# Patient Record
Sex: Male | Born: 1970 | Hispanic: Yes | Marital: Single | Smoking: Never smoker
Health system: Southern US, Community
[De-identification: ages and names within clinical notes are randomized; demographics above are authoritative.]

## PROBLEM LIST (undated history)

## (undated) DIAGNOSIS — Z87442 Personal history of urinary calculi: Secondary | ICD-10-CM

## (undated) HISTORY — PX: NO PAST SURGERIES: SHX2092

---

## 2013-08-17 ENCOUNTER — Emergency Department (HOSPITAL_COMMUNITY)
Admission: EM | Admit: 2013-08-17 | Discharge: 2013-08-17 | Disposition: A | Discharge status: Home / Self Care | Payer: Self-pay | Attending: Emergency Medicine | Admitting: Emergency Medicine

## 2013-08-17 ENCOUNTER — Encounter (HOSPITAL_COMMUNITY): Payer: Self-pay | Admitting: Emergency Medicine

## 2013-08-17 ENCOUNTER — Emergency Department (HOSPITAL_COMMUNITY): Payer: Self-pay

## 2013-08-17 DIAGNOSIS — R112 Nausea with vomiting, unspecified: Secondary | ICD-10-CM | POA: Insufficient documentation

## 2013-08-17 DIAGNOSIS — R1032 Left lower quadrant pain: Secondary | ICD-10-CM | POA: Insufficient documentation

## 2013-08-17 DIAGNOSIS — R1033 Periumbilical pain: Secondary | ICD-10-CM | POA: Insufficient documentation

## 2013-08-17 DIAGNOSIS — R1110 Vomiting, unspecified: Secondary | ICD-10-CM

## 2013-08-17 DIAGNOSIS — R197 Diarrhea, unspecified: Secondary | ICD-10-CM | POA: Insufficient documentation

## 2013-08-17 DIAGNOSIS — R1013 Epigastric pain: Secondary | ICD-10-CM | POA: Insufficient documentation

## 2013-08-17 LAB — CBC WITH DIFFERENTIAL/PLATELET
Basophils Absolute: 0 10*3/uL (ref 0.0–0.1)
Basophils Relative: 0 % (ref 0–1)
EOS PCT: 1 % (ref 0–5)
Eosinophils Absolute: 0.1 10*3/uL (ref 0.0–0.7)
HEMATOCRIT: 43 % (ref 39.0–52.0)
Hemoglobin: 14.9 g/dL (ref 13.0–17.0)
LYMPHS ABS: 1.6 10*3/uL (ref 0.7–4.0)
LYMPHS PCT: 15 % (ref 12–46)
MCH: 30.2 pg (ref 26.0–34.0)
MCHC: 34.7 g/dL (ref 30.0–36.0)
MCV: 87 fL (ref 78.0–100.0)
MONO ABS: 1 10*3/uL (ref 0.1–1.0)
Monocytes Relative: 10 % (ref 3–12)
Neutro Abs: 7.9 10*3/uL — ABNORMAL HIGH (ref 1.7–7.7)
Neutrophils Relative %: 74 % (ref 43–77)
Platelets: 301 10*3/uL (ref 150–400)
RBC: 4.94 MIL/uL (ref 4.22–5.81)
RDW: 13 % (ref 11.5–15.5)
WBC: 10.7 10*3/uL — AB (ref 4.0–10.5)

## 2013-08-17 LAB — LIPASE, BLOOD: Lipase: 42 U/L (ref 11–59)

## 2013-08-17 LAB — COMPREHENSIVE METABOLIC PANEL
ALT: 14 U/L (ref 0–53)
AST: 21 U/L (ref 0–37)
Albumin: 4 g/dL (ref 3.5–5.2)
Alkaline Phosphatase: 116 U/L (ref 39–117)
BUN: 14 mg/dL (ref 6–23)
CO2: 23 mEq/L (ref 19–32)
CREATININE: 0.85 mg/dL (ref 0.50–1.35)
Calcium: 9.6 mg/dL (ref 8.4–10.5)
Chloride: 103 mEq/L (ref 96–112)
Glucose, Bld: 102 mg/dL — ABNORMAL HIGH (ref 70–99)
Potassium: 3.7 mEq/L (ref 3.7–5.3)
Sodium: 142 mEq/L (ref 137–147)
Total Bilirubin: 0.5 mg/dL (ref 0.3–1.2)
Total Protein: 7.9 g/dL (ref 6.0–8.3)

## 2013-08-17 MED ORDER — IOHEXOL 300 MG/ML  SOLN
20.0000 mL | INTRAMUSCULAR | Status: DC
  Administered 2013-08-17: 20 mL via ORAL

## 2013-08-17 MED ORDER — ONDANSETRON HCL 4 MG/2ML IJ SOLN
4.0000 mg | Freq: Once | INTRAMUSCULAR | Status: AC
  Administered 2013-08-17: 4 mg via INTRAVENOUS
  Filled 2013-08-17: qty 2

## 2013-08-17 MED ORDER — IOHEXOL 300 MG/ML  SOLN
100.0000 mL | Freq: Once | INTRAMUSCULAR | Status: AC | PRN
  Administered 2013-08-17: 100 mL via INTRAVENOUS

## 2013-08-17 MED ORDER — ONDANSETRON 4 MG PO TBDP: 4.0000 mg | ORAL_TABLET | Freq: Three times a day (TID) | ORAL | Status: DC | PRN

## 2013-08-17 MED ORDER — MORPHINE SULFATE 4 MG/ML IJ SOLN
4.0000 mg | Freq: Once | INTRAMUSCULAR | Status: AC
  Administered 2013-08-17: 4 mg via INTRAVENOUS
  Filled 2013-08-17: qty 1

## 2013-08-17 MED ORDER — SODIUM CHLORIDE 0.9 % IV BOLUS (SEPSIS)
1000.0000 mL | Freq: Once | INTRAVENOUS | Status: AC
  Administered 2013-08-17: 1000 mL via INTRAVENOUS

## 2013-08-17 NOTE — ED Notes (Signed)
Contrast given

## 2013-08-17 NOTE — ED Notes (Signed)
Spanish speaking patient, interpreter phones used, presents with 2 days of diarrhea, nausea and vomting. Describes diarrhea as black and loose going about 2-3 times per day. Reports weakness. Generalized abdominal pain.

## 2013-08-17 NOTE — Discharge Instructions (Signed)
Nuseas y Vmitos (Nausea and Vomiting) La nusea es la sensacin de Tree surgeon en el estmago o de la necesidad de vomitar. El vmito es un reflejo por el que los contenidos del estmago salen por la boca. El vmito puede ocasionar prdida de lquidos del organismo (deshidratacin). Los nios y los [employer] pueden deshidratarse rpidamente (en especial si tambin tienen diarrea). Las nuseas y los vmitos son sntoma de un trastorno o enfermedad. Es importante Energy manager causa de los sntomas. CAUSAS  Irritacin directa de la membrana que cubre el Carmine. Esta irritacin puede ser resultado del aumento de la produccin de cido, (reflujo gastroesofgico), infecciones, intoxicacin alimentaria, ciertos medicamentos (como antinflamatorios no esteroideos), consumo de alcohol o de tabaco.  Seales del cerebro.Estas seales pueden ser un dolor de cabeza, exposicin al calor, trastornos del odo interno, aumento de la presin en el cerebro por lesiones, infeccin, un tumor o conmocin cerebral, estmulos emocionales o problemas metablicos.  Una obstruccin en el tracto gastrointestinal (obstruccin intestinal).  Ciertas enfermedades como la diabetes, problemas en la vescula biliar, apendicitis, problemas renales, cncer, sepsis, sntomas atpicos de infarto o trastornos alimentarios.  Tratamientos mdicos como la quimioterapia y la radiacin.  Medicamentos que inducen al sueo (anestesia general) durante Clementeen Hoof. DIAGNSTICO  El mdico podr solicitarle algunos anlisis si los problemas no mejoran luego de algunos das. Tambin podrn pedirle anlisis si los sntomas son graves o si el motivo de los vmitos o las nuseas no est claro. Los [employer]SCO ser:   Anlisis de [place].  Anlisis de [place].  Pruebas de materia fecal.  Cultivos (para buscar evidencias de infeccin).  Radiografas u otros estudios por imgenes. Los [employer] de las pruebas lo ayudarn al mdico a  tomar decisiones acerca del mejor curso de tratamiento o la necesidad de [employer].  TRATAMIENTO  Debe estar bien hidratado. Beba con frecuencia pequeas cantidades de lquido.Puede beber agua, bebidas deportivas, caldos claros o comer pequeos trocitos de hielo o gelatina para mantenerse hidratado.Cuando coma, hgalo lentamente para evitar las nuseas.Hay medicamentos para evitar las nuseas que pueden aliviarlo.  INSTRUCCIONES PARA EL CUIDADO DOMICILIARIO  Si su mdico le prescribe medicamentos tmelos como se le haya indicado.  Si no tiene hambre, no se fuerce a comer. Sin embargo, es necesario que tome lquidos.  Si tiene hambre alimntese con una dieta normal, a menos que el mdico le indique otra cosa.  Los mejores alimentos son Ardelia Mems combinacin de carbohidratos complejos (arroz, trigo, papas, pan), carnes magras, yogur, frutas y Photographer.  Evite los alimentos ricos en grasas porque dificultan la digestin.  Beba gran cantidad de lquido para mantener la orina de tono claro o color amarillo plido.  Si est deshidratado, consulte a su mdico para que le d instrucciones especficas para volver a hidratarlo. Los signos de deshidratacin son:  Doristine Section sed.  Labios y boca secos.  Mareos.  Elmon Else.  Disminucin de la frecuencia y cantidad de la [place].  Confusin.  Tiene el pulso o la respiracin acelerados. SOLICITE ATENCIN MDICA DE INMEDIATO SI:  Vomita sangre o algo similar a la borra del caf.  La materia fecal (heces) es negra o tiene [address].  Sufre una cefalea grave o rigidez en el cuello.  Se siente confundido.  Siente dolor abdominal intenso.  Tiene dolor en el pecho o dificultad para respirar.  No orina por 8 horas.  Tiene la piel fra y pegajosa.  Sigue vomitando durante ms de 24 a 48 horas.  Tiene fiebre. ASEGRESE QUE:   Comprende  estas instrucciones.  Controlar su enfermedad.  Solicitar ayuda inmediatamente si no mejora o  si empeora. Document Released: 04/10/2007 Document Revised: 06/13/2011 481 [employer]ExitCare Patient Information 2014 [place]Care, [place]. Use the Zofran as needed for nausea    Emergency Department Resource Guide 1) Find a Doctor and Pay Out of Pocket Although you won't have to find out who is covered by your insurance plan, it is a good idea to ask around and get recommendations. You will then need to call the office and see if the doctor you have chosen will accept you as a new patient and what types of options they offer for patients who are self-pay. Some doctors offer discounts or will set up payment plans for their patients who do not have insurance, but you will need to ask so you aren't surprised when you get to your appointment.  2) Contact Your Local Health Department Not all health departments have doctors that can see patients for sick visits, but many do, so it is worth a call to see if yours does. If you don't know where your local health department is, you can check in your phone book. The CDC also has a tool to help you locate your state's health department, and many state websites also have listings of all of their local health departments.  3) Find a Walk-in Clinic If your illness is not likely to be very severe or complicated, you may want to try a walk in clinic. These are popping up all over the country in pharmacies, drugstores, and shopping centers. They're usually staffed by nurse practitioners or physician assistants that have been trained to treat common illnesses and complaints. They're usually fairly quick and inexpensive. However, if you have serious medical issues or chronic medical problems, these are probably not your best option.  No Primary Care Doctor: - Call Health Connect at  [phone] - they can help you locate a primary care doctor that  accepts your insurance, provides certain services, etc. - Physician Referral Service- [phone]  Chronic Pain  Problems: Organization         Address  Phone   Notes  [facility]  [phone] Patients need to be referred by their primary care doctor.   Medication Assistance: Organization         Address  Phone   Notes  [facility] Medication [facility] [address] [place], [place] [address] [phone] --Must be a resident of [facility] [place] -- Must have NO insurance coverage whatsoever (no Medicaid/ Medicare, etc.) -- The pt. MUST have a primary care doctor that directs their care regularly and follows them in the community   MedAssist  [phone]   [employer]  [phone]    Agencies that provide inexpensive medical care: Organization         Address  Phone   Notes  [facility]  [phone]   [facility]    [phone]   [facility] [address] [place], [place] [address] [phone]   [facility] [address], [place] [phone]   Planned Parenthood    [phone]   [facility]    [phone]   [facility]  [address], [place] Phone:  [phone], Fax:  [phone] Hours of Operation:  9 am - 6 pm, M-F.  Also accepts Medicaid/Medicare and self-pay.  [facility]  [place] for [place], [address], [place] Phone: [phone], Fax: [phone]. Hours of Operation:  8:30 am - 5:30 pm, M-F.  Also accepts Medicaid and self-pay.  [facility] [address] [place] Phone: [phone]   [place], [place], [place] [phone], Ext. [phone] Mondays & Thursdays: 7-9 AM.  First 15 patients are seen on a first come, first serve basis.    [place] Providers:  Organization         Address  Phone   Notes  [facility] [address], [place] [phone] Also  accepts self-pay patients.  [facility] [address], [place]  [phone]   [place], [address], [place] [phone]   [facility] [address] [place] [phone]   Lucianne Lei [address], [place]   [phone] Only accepts [place] Access [place] patients after they have their name applied to their card.   Self-Pay (no insurance) in [facility]:  Organization         Address  Phone   Notes  Sickle Cell Patients, [facility] [place] [phone]   [facility] [place] [phone]   [facility] [place]  [place], [place], [place] [phone]   [facility]/Dr. Osei-Bonsu  [address] [place] or [address], [place] [phone] Phone number for both [place] and [place] locations is the same.  Urgent Medical and [facility] [address] [place] [phone]   [facility] [address], [place] or [address] [phone]   [facility] [address], [place] [phone], phone; [phone], fax Sees patients 1st and 3rd Saturday of every month.  Must not qualify for public or private insurance (i.e. Medicaid, Medicare, [place] Health Choice, Veterans' Benefits)  Household income should be no more than 200% of the poverty level The clinic cannot treat you if you are pregnant or think you are pregnant  Sexually transmitted diseases are not treated at the clinic.    Dental Care: Organization         Address  Phone  Notes  [facility] Department of [facility] [place] [phone] Accepts children up to age 72 who are enrolled in [place] or [place]; pregnant  women with a Medicaid card; and children who have applied for Medicaid or [place] Health Choice, but were declined, whose parents can pay a reduced fee at time of service.  [facility] Department of [facility]  [address], [place] [phone] Accepts children up to age 22 who are enrolled in [place] or [place]; pregnant women with a Medicaid card; and children who have applied for Medicaid or [place] Health Choice, but were declined, whose parents can pay a reduced fee at time of service.  [facility] PRO[facility]  [place] [phone] Patients are seen by appointment only. Walk-ins are not accepted. Home will see patients 90 years of age and older. Monday - Tuesday (8am-5pm) Most Wednesdays (8:30-5pm) $30 per visit,  cash only  [employer] Adult [employer] PROGRAM  [address], [place] [phone] Patients are seen by appointment only. Walk-ins are not accepted. [place] will see patients 1 years of age and older. One Wednesday Evening (Monthly: Volunteer Based).  $30 per visit, cash only  [place]  [phone] for adults; Children under age 73, call Graduate Pediatric Dentistry at [phone]. Children aged 60-14, please call [phone] to request a pediatric application.  Dental services are provided in all areas of dental care including fillings, crowns and bridges, complete and partial dentures, implants, gum treatment, root canals, and extractions. Preventive care is also provided. Treatment is provided to both adults and children. Patients are selected via a lottery and there is often a waiting list.   [facility] [address] [place]  [phone] [email or web address]   [facility] [address] [place], [place] [phone], Ext. [phone] Second and Fourth Thursday of each month, opens at 6:30 AM; Clinic ends at 9 AM.  Patients are  seen on a first-come first-served basis, and a limited number are seen during each clinic.   [facility]  [address] Hillard Danker [place], [place] [phone]   Eligibility Requirements You must have lived in [place], [place], or [place] counties for at least the last three months.   You cannot be eligible for state or federal sponsored [employer], including [employer], [place], or [employer].   You generally cannot be eligible for healthcare insurance through your employer.    How to apply: Eligibility screenings are held every Tuesday and Wednesday afternoon from 1:00 pm until 4:00 pm. You do not need an appointment for the interview!  [facility] [address], [place], [place]   [place]  [place] Department  [place]  [phone]    Behavioral Health Resources in the Community: Intensive Outpatient Programs Organization         Address  Phone  Notes  [place]. [address], [place], [place] [phone]   [facility] Outpatient [address] [place], [place]   ADS: Alcohol & Drug Svcs [address] [place], [place]   [place] [address]  [place], [place] or [phone]   Substance Abuse Resources Organization         Address  Phone  Notes  Alcohol and Drug Services  [phone]   [place]  [phone]   [facility] [place]   Chinita Pester  [phone]   Residential & Outpatient Substance Abuse Program  [phone]   Psychological Services Organization         Address  Phone  Notes  [facility] [place]  [place]  [phone]   [place] [address], [place] or [phone]    Mobile Crisis  Teams Organization         Address  Phone  Notes  [facility]rapeutic Alternatives, Mobile Crisis Care Unit  [phone]   Assertive Psychotherapeutic Services  [address] [place], [place]   [place] Levels [address], [place] [phone]    Self-Help/Support Groups Organization         Address  Phone  Notes  Mental Health Assoc. of [place] - variety of support groups  [place] Call for more information  Narcotics Anonymous (NA), Caring Services [address], [employer] [place]  2 meetings at this location   Special educational needs teacher         Address  Phone  Notes  ASAP Residential Treatment [place],    [place]  [phone]   [facility]  [address] [place] 732202, [place], [place]   [place], [place] [phone] Admissions: 8am-3pm M-F  Incentives Substance [place] [address],    [place], [place] [phone]   [facility] [address] [place], [place], [place]   [facility] [address]  [place], [place]   Insight Programs - Intensive Outpatient [address] Kristeen [address], [place], [place]   [facility] ([place]  [place], [place] [phone] or [phone]   [facility]idential Treatment Services (RTS) [address] [place], [place] Accepts Medicaid  [facility] [address]  [place] [phone] Substance Abuse/Addiction Treatment   [facility] Organization         Address  Phone  Notes  [facility]  [phone]   Domenic Schwab, PhD [address] Arlis Porta [place], [place]   [phone] or [phone]   [place], [place] [phone]   [facility] [address] [place], [place] [phone]  Insurance/Medicaid/sponsorship through [facility] and Families [address]                                    [place], [place] [phone] [place] [address], [place] [phone]    Dr. Adele Schilder  [phone]   [facility] Dept. 1) [address] [place] 2) [place] 3)  [place] [address], [place] [phone]  [phone]   [place] [phone] or [phone] (After Hours)

## 2013-08-17 NOTE — ED Provider Notes (Signed)
CSN: [id number]     Arrival date & time 08/17/13  1620 History   First MD Initiated Contact with Patient 08/17/13 1750     Chief Complaint  Patient presents with  . Emesis     (Consider location/radiation/quality/duration/timing/severity/associated sxs/prior Treatment) HPI Comments: The patient is a 43 year old Hispanic male presenting to the emergency department with a chief complaint of vomiting, abdominal pain, diarrhea for 3 days. The patient reports a gradual onset of epigastric discomfort and an associated vomiting. He reports 8 nonbloody emesis. He reports associated loose stool, denies bright red blood, reports dark melanotic stool or 3 days. He denies recent travel, antibiotic use, history of peptic ulcer disease, abdominal surgeries.  Denies aggravating or relieving factors. He reports taking Dramamine without full resolution of the symptoms.  The history is provided by the patient. A language interpreter was used.    History reviewed. No pertinent past medical history. History reviewed. No pertinent past surgical history. History reviewed. No pertinent family history. History  Substance Use Topics  . Smoking status: Never Smoker   . Smokeless tobacco: Not on file  . Alcohol Use: No    Review of Systems  Constitutional: Negative for fever and chills.  Gastrointestinal: Positive for nausea, vomiting, abdominal pain and diarrhea. Negative for constipation.  All other systems reviewed and are negative.     Allergies  Review of patient's allergies indicates no known allergies.  Home Medications   Prior to Admission medications   Not on File   BP 137/88  Pulse 84  Temp(Src) 98.1 F (36.7 C) (Oral)  Resp 18  Ht 5\' 3"  (1.6 m)  Wt 157 lb 8 oz (71.442 kg)  BMI 27.91 kg/m2  SpO2 99% Physical Exam  Nursing note and vitals reviewed. Constitutional: He is oriented to person, place, and time. He appears well-developed and well-nourished. No distress.  HENT:  Head:  Normocephalic and atraumatic.  Eyes: EOM are normal. Pupils are equal, round, and reactive to light. No scleral icterus.  Neck: Neck supple.  Cardiovascular: Normal rate, regular rhythm and normal heart sounds.   No murmur heard. Pulmonary/Chest: Effort normal and breath sounds normal. He has no wheezes.  Abdominal: Soft. Normal appearance and bowel sounds are normal. He exhibits mass. There is tenderness in the epigastric area, periumbilical area and left lower quadrant. There is no rebound, no guarding and negative Murphy's sign.  Musculoskeletal: Normal range of motion. He exhibits no edema.  Neurological: He is alert and oriented to person, place, and time.  Skin: Skin is warm and dry. No rash noted.  Psychiatric: He has a normal mood and affect.    ED Course  Procedures (including critical care time) Labs Review Labs Reviewed  CBC WITH DIFFERENTIAL - Abnormal; Notable for the following:    WBC 10.7 (*)    Neutro Abs 7.9 (*)    All other components within normal limits  COMPREHENSIVE METABOLIC PANEL - Abnormal; Notable for the following:    Glucose, Bld 102 (*)    All other components within normal limits  LIPASE, BLOOD    Imaging Review Ct Abdomen Pelvis W Contrast  08/17/2013   CLINICAL DATA:  Nausea, vomiting and diarrhea. Diffuse abdominal pain.  EXAM: CT ABDOMEN AND PELVIS WITH CONTRAST  TECHNIQUE: Multidetector CT imaging of the abdomen and pelvis was performed using the standard protocol following bolus administration of intravenous contrast.  CONTRAST:  100mL OMNIPAQUE IOHEXOL 300 MG/ML  SOLN  COMPARISON:  None.  FINDINGS: Normal appearing liver, spleen, pancreas,  gallbladder, adrenal glands, kidneys, urinary bladder and prostate gland. Note gastrointestinal abnormalities or enlarged lymph nodes. Normal appearing appendix. No free peritoneal fluid or air. Clear lung bases. Mild lumbar and lower thoracic spine degenerative changes.  IMPRESSION: No acute abnormality.    Electronically Signed   By: Gordan PaymentSteve  Reid M.D.   On: 08/17/2013 22:42     EKG Interpretation None      MDM   Final diagnoses:  Vomiting   Pt with vomiting and diarrhea for 48 hours. LLQ tenderness and epigastric tenderness on exam, slight leukocytosis 10.7. CT ordered. Pt care assumed by Manus RuddSchulz, NP at shift change awaiting CT results and likely discharge home.  Meds given in ED:  Medications  sodium chloride 0.9 % bolus 1,000 mL (not administered)  ondansetron (ZOFRAN) injection 4 mg (not administered)  morphine 4 MG/ML injection 4 mg (not administered)    Discharge Medication List as of 08/17/2013 10:51 PM    START taking these medications   Details  ondansetron (ZOFRAN ODT) 4 MG disintegrating tablet Take 1 tablet (4 mg total) by mouth every 8 (eight) hours as needed for nausea or vomiting., Starting 08/17/2013, Until Discontinued, Print            Clabe SealLauren M Avneet Ashmore, PA-C 08/19/13 1156

## 2013-08-17 NOTE — ED Notes (Addendum)
Occult card at bedside 

## 2013-08-17 NOTE — ED Notes (Signed)
Completed contrast. CT made aware.

## 2013-08-17 NOTE — ED Notes (Signed)
Pt came to the ED for n/v/d since Thursday. No blood in stool or vomit per pt. Generalized abdominal cramping per pt. No cardiac or respiratory distress. Will continue to monitor.

## 2013-08-17 NOTE — ED Provider Notes (Signed)
CT scan is normal.  The patient.  Has not had no more episodes of vomiting, although at time of report of his CT scan.  He complains that he is slightly, nauseated.  He, will be given, an additional dose of Zofran, IV prior to discharge.  He will receive a prescription for Zofran, and I resource guide to help him find a primary care physician  Arman FilterGail K Bryer Cozzolino, NP 08/17/13 2252

## 2013-08-18 NOTE — ED Provider Notes (Signed)
Medical screening examination/treatment/procedure(s) were performed by non-physician practitioner and as supervising physician I was immediately available for consultation/collaboration.   EKG Interpretation None        Gilda Creasehristopher J. Anelia Carriveau, MD 08/18/13 1026

## 2013-08-20 ENCOUNTER — Emergency Department (HOSPITAL_COMMUNITY)
Admission: EM | Admit: 2013-08-20 | Discharge: 2013-08-21 | Disposition: A | Discharge status: Home / Self Care | Payer: Self-pay | Attending: Emergency Medicine | Admitting: Emergency Medicine

## 2013-08-20 ENCOUNTER — Encounter (HOSPITAL_COMMUNITY): Payer: Self-pay | Admitting: Emergency Medicine

## 2013-08-20 DIAGNOSIS — R112 Nausea with vomiting, unspecified: Secondary | ICD-10-CM | POA: Insufficient documentation

## 2013-08-20 DIAGNOSIS — R1013 Epigastric pain: Secondary | ICD-10-CM | POA: Insufficient documentation

## 2013-08-20 DIAGNOSIS — R1110 Vomiting, unspecified: Secondary | ICD-10-CM

## 2013-08-20 DIAGNOSIS — Z79899 Other long term (current) drug therapy: Secondary | ICD-10-CM | POA: Insufficient documentation

## 2013-08-20 NOTE — ED Notes (Signed)
Pt. reports persistent emesis seen here last 08/17/2013 - blood tests / CT abdomen done , discharged home with prescription Zofran with no relief. Denies fever or chills. No diarrhea.

## 2013-08-21 ENCOUNTER — Emergency Department (HOSPITAL_COMMUNITY): Payer: Self-pay

## 2013-08-21 LAB — COMPREHENSIVE METABOLIC PANEL
ALBUMIN: 3.7 g/dL (ref 3.5–5.2)
ALT: 8 U/L (ref 0–53)
AST: 9 U/L (ref 0–37)
Alkaline Phosphatase: 109 U/L (ref 39–117)
BILIRUBIN TOTAL: 0.3 mg/dL (ref 0.3–1.2)
BUN: 9 mg/dL (ref 6–23)
CO2: 25 mEq/L (ref 19–32)
CREATININE: 0.79 mg/dL (ref 0.50–1.35)
Calcium: 9.3 mg/dL (ref 8.4–10.5)
Chloride: 99 mEq/L (ref 96–112)
GFR calc Af Amer: >90 mL/min (ref >90)
GFR calc non Af Amer: >90 mL/min (ref >90)
Glucose, Bld: 107 mg/dL — ABNORMAL HIGH (ref 70–99)
Potassium: 3.7 mEq/L (ref 3.7–5.3)
Sodium: 138 mEq/L (ref 137–147)
TOTAL PROTEIN: 7.6 g/dL (ref 6.0–8.3)

## 2013-08-21 LAB — TROPONIN I

## 2013-08-21 LAB — CBC
HCT: 41.6 % (ref 39.0–52.0)
Hemoglobin: 14.4 g/dL (ref 13.0–17.0)
MCH: 30.1 pg (ref 26.0–34.0)
MCHC: 34.6 g/dL (ref 30.0–36.0)
MCV: 87 fL (ref 78.0–100.0)
PLATELETS: 315 10*3/uL (ref 150–400)
RBC: 4.78 MIL/uL (ref 4.22–5.81)
RDW: 12.8 % (ref 11.5–15.5)
WBC: 13.3 10*3/uL — ABNORMAL HIGH (ref 4.0–10.5)

## 2013-08-21 LAB — URINALYSIS, ROUTINE W REFLEX MICROSCOPIC
Bilirubin Urine: NEGATIVE
Glucose, UA: NEGATIVE mg/dL
HGB URINE DIPSTICK: NEGATIVE
Ketones, ur: 15 mg/dL — AB
LEUKOCYTES UA: NEGATIVE
Nitrite: NEGATIVE
PROTEIN: NEGATIVE mg/dL
Specific Gravity, Urine: 1.023 (ref 1.005–1.030)
UROBILINOGEN UA: 0.2 mg/dL (ref 0.0–1.0)
pH: 6.5 (ref 5.0–8.0)

## 2013-08-21 LAB — LIPASE, BLOOD: Lipase: 42 U/L (ref 11–59)

## 2013-08-21 MED ORDER — PANTOPRAZOLE SODIUM 40 MG IV SOLR
40.0000 mg | Freq: Once | INTRAVENOUS | Status: AC
  Administered 2013-08-21: 40 mg via INTRAVENOUS
  Filled 2013-08-21: qty 40

## 2013-08-21 MED ORDER — PROMETHAZINE HCL 25 MG RE SUPP: 25.0000 mg | Freq: Four times a day (QID) | RECTAL | Status: DC | PRN

## 2013-08-21 MED ORDER — FAMOTIDINE 20 MG PO TABS: 20.0000 mg | ORAL_TABLET | Freq: Two times a day (BID) | ORAL | Status: DC

## 2013-08-21 MED ORDER — SODIUM CHLORIDE 0.9 % IV BOLUS (SEPSIS)
1000.0000 mL | Freq: Once | INTRAVENOUS | Status: AC
  Administered 2013-08-21: 1000 mL via INTRAVENOUS

## 2013-08-21 MED ORDER — PROMETHAZINE HCL 25 MG/ML IJ SOLN
25.0000 mg | Freq: Once | INTRAMUSCULAR | Status: AC
  Administered 2013-08-21: 25 mg via INTRAVENOUS
  Filled 2013-08-21: qty 1

## 2013-08-21 MED ORDER — FAMOTIDINE 20 MG PO TABS
20.0000 mg | ORAL_TABLET | Freq: Once | ORAL | Status: AC
  Administered 2013-08-21: 20 mg via ORAL
  Filled 2013-08-21: qty 1

## 2013-08-21 MED ORDER — GI COCKTAIL ~~LOC~~
30.0000 mL | Freq: Once | ORAL | Status: AC
  Administered 2013-08-21: 30 mL via ORAL
  Filled 2013-08-21: qty 30

## 2013-08-21 NOTE — Discharge Instructions (Signed)
Náuseas y Vómitos  (Nausea and Vomiting)  La náusea es la sensación de malestar en el estómago o de la necesidad de vomitar. El vómito es un reflejo por el que los contenidos del estómago salen por la boca. El vómito puede ocasionar pérdida de líquidos del organismo (deshidratación). Los niños y los adultos mayores pueden deshidratarse rápidamente (en especial si también tienen diarrea). Las náuseas y los vómitos son síntoma de un trastorno o enfermedad. Es importante averiguar la causa de los síntomas.  CAUSAS  · Irritación directa de la membrana que cubre el estómago. Esta irritación puede ser resultado del aumento de la producción de ácido, (reflujo gastroesofágico), infecciones, intoxicación alimentaria, ciertos medicamentos (como antinflamatorios no esteroideos), consumo de alcohol o de tabaco.  · Señales del cerebro. Estas señales pueden ser un dolor de cabeza, exposición al calor, trastornos del oído interno, aumento de la presión en el cerebro por lesiones, infección, un tumor o conmoción cerebral, estímulos emocionales o problemas metabólicos.  · Una obstrucción en el tracto gastrointestinal (obstrucción intestinal).  · Ciertas enfermedades como la diabetes, problemas en la vesícula biliar, apendicitis, problemas renales, cáncer, sepsis, síntomas atípicos de infarto o trastornos alimentarios.  · Tratamientos médicos como la quimioterapia y la radiación.  · Medicamentos que inducen al sueño (anestesia general) durante una cirugía.  DIAGNÓSTICO   El médico podrá solicitarle algunos análisis si los problemas no mejoran luego de algunos días. También podrán pedirle análisis si los síntomas son graves o si el motivo de los vómitos o las náuseas no está claro. Los análisis pueden ser:   · Análisis de orina.  · Análisis de sangre.  · Pruebas de materia fecal.  · Cultivos (para buscar evidencias de infección).  · Radiografías u otros estudios por imágenes.  Los resultados de las pruebas lo ayudarán al médico a  tomar decisiones acerca del mejor curso de tratamiento o la necesidad de análisis adicionales.   TRATAMIENTO   Debe estar bien hidratado. Beba con frecuencia pequeñas cantidades de líquido. Puede beber agua, bebidas deportivas, caldos claros o comer pequeños trocitos de hielo o gelatina para mantenerse hidratado. Cuando coma, hágalo lentamente para evitar las náuseas. Hay medicamentos para evitar las náuseas que pueden aliviarlo.   INSTRUCCIONES PARA EL CUIDADO DOMICILIARIO  · Si su médico le prescribe medicamentos tómelos como se le haya indicado.  · Si no tiene hambre, no se fuerce a comer. Sin embargo, es necesario que tome líquidos.  · Si tiene hambre aliméntese con una dieta normal, a menos que el médico le indique otra cosa.  · Los mejores alimentos son una combinación de carbohidratos complejos (arroz, trigo, papas, pan), carnes magras, yogur, frutas y vegetales.  · Evite los alimentos ricos en grasas porque dificultan la digestión.  · Beba gran cantidad de líquido para mantener la orina de tono claro o color amarillo pálido.  · Si está deshidratado, consulte a su médico para que le dé instrucciones específicas para volver a hidratarlo. Los signos de deshidratación son:  · Mucha sed.  · Labios y boca secos.  · Mareos.  · Orina oscura.  · Disminución de la frecuencia y cantidad de la orina.  · Confusión.  · Tiene el pulso o la respiración acelerados.  SOLICITE ATENCIÓN MÉDICA DE INMEDIATO SI:  · Vomita sangre o algo similar a la borra del café.  · La materia fecal (heces) es negra o tiene sangre.  · Sufre una cefalea grave o rigidez en el cuello.  · Se siente confundido.  · Siente dolor abdominal intenso.  ·   Tiene dolor en el pecho o dificultad para respirar.  · No orina por 8 horas.  · Tiene la piel fría y pegajosa.  · Sigue vomitando durante más de 24 a 48 horas.  · Tiene fiebre.  ASEGÚRESE QUE:   · Comprende estas instrucciones.  · Controlará su enfermedad.  · Solicitará ayuda inmediatamente si no mejora o  si empeora.  Document Released: 04/10/2007 Document Revised: 06/13/2011  [employer]® Patient Information ©2014 [employer].

## 2013-08-21 NOTE — ED Provider Notes (Signed)
Medical screening examination/treatment/procedure(s) were conducted as a shared visit with non-physician practitioner(s) and myself.  I personally evaluated the patient during the encounter.   EKG Interpretation None      Patient presented to the ER for evaluation of nausea, vomiting, diarrhea. He has abdominal pain as well. Pain is across the upper abdomen as well as left lower abdomen. Was unremarkable. CT scan performed in no acute pathology seen. Patient discharged with symptomatic treatment.  Gilda Creasehristopher J. Pollina, MD 08/21/13 [id number]

## 2013-08-21 NOTE — ED Notes (Signed)
Pt taken to xray 

## 2013-08-22 NOTE — ED Provider Notes (Signed)
CSN: [id number]     Arrival date & time 08/20/13  2044 History   First MD Initiated Contact with Patient 08/21/13 0045     Chief Complaint  Patient presents with  . Emesis     (Consider location/radiation/quality/duration/timing/severity/associated sxs/prior Treatment) HPI Hx per PT and family who help translate Spanish to [place]. Epigastric pain and associated N/V ongoing the last few days without known alleviating factors, evaluated here for the same and sent home with RX which is not helping. No F/C, no diarrhea, no sick contacts, no recent travel, no rashes. No known bad food exposures, denies any heartburn or h/o same otherwise, symptoms MOD in severity.  History reviewed. No pertinent past medical history. History reviewed. No pertinent past surgical history. No family history on file. History  Substance Use Topics  . Smoking status: Never Smoker   . Smokeless tobacco: Not on file  . Alcohol Use: No    Review of Systems  Constitutional: Negative for fever and chills.  Respiratory: Negative for shortness of breath.   Cardiovascular: Negative for chest pain.  Gastrointestinal: Positive for nausea, vomiting and abdominal pain. Negative for blood in stool.  Genitourinary: Negative for hematuria and flank pain.  Musculoskeletal: Negative for back pain, neck pain and neck stiffness.  Skin: Negative for rash.  Neurological: Negative for weakness and headaches.  All other systems reviewed and are negative.     Allergies  Review of patient's allergies indicates no known allergies.  Home Medications   Prior to Admission medications   Medication Sig Start Date End Date Taking? Authorizing Provider  ondansetron (ZOFRAN ODT) 4 MG disintegrating tablet Take 1 tablet (4 mg total) by mouth every 8 (eight) hours as needed for nausea or vomiting. 08/17/13  Yes Arman FilterGail K Schulz, NP  famotidine (PEPCID) 20 MG tablet Take 1 tablet (20 mg total) by mouth 2 (two) times daily. 08/21/13   Sunnie NielsenBrian  Loring Liskey, MD  promethazine (PHENERGAN) 25 MG suppository Place 1 suppository (25 mg total) rectally every 6 (six) hours as needed for nausea or vomiting. 08/21/13   Sunnie NielsenBrian Bryar Rennie, MD   BP 117/70  Pulse 68  Temp(Src) 99.7 F (37.6 C) (Oral)  Resp 16  Ht 5\' 7"  (1.702 m)  Wt 154 lb (69.854 kg)  BMI 24.11 kg/m2  SpO2 97% Physical Exam  Constitutional: He is oriented to person, place, and time. He appears well-developed and well-nourished.  HENT:  Head: Normocephalic and atraumatic.  Mouth/Throat: Oropharynx is clear and moist.  Eyes: EOM are normal. Pupils are equal, round, and reactive to light. No scleral icterus.  Neck: Neck supple.  Cardiovascular: Normal rate, regular rhythm and intact distal pulses.   Pulmonary/Chest: Effort normal and breath sounds normal. No respiratory distress.  Abdominal: Soft. Bowel sounds are normal. He exhibits no distension and no mass. There is no rebound and no guarding.  Mild epigastric tenderness, no RUQ tenderness and neg Murphys sign  Musculoskeletal: Normal range of motion. He exhibits no edema.  Neurological: He is alert and oriented to person, place, and time. No cranial nerve deficit.  Skin: Skin is warm and dry. No rash noted.    ED Course  Procedures (including critical care time) Labs Review Labs Reviewed  URINALYSIS, ROUTINE W REFLEX MICROSCOPIC - Abnormal; Notable for the following:    APPearance CLOUDY (*)    Ketones, ur 15 (*)    All other components within normal limits  CBC - Abnormal; Notable for the following:    WBC 13.3 (*)  All other components within normal limits  COMPREHENSIVE METABOLIC PANEL - Abnormal; Notable for the following:    Glucose, Bld 107 (*)    All other components within normal limits  LIPASE, BLOOD  TROPONIN I    Imaging Review [place] Abdomen Complete  08/21/2013   CLINICAL DATA:  Epigastric pain.  EXAM: ULTRASOUND ABDOMEN COMPLETE  COMPARISON:  No priors.  FINDINGS: Gallbladder:  No gallstones or wall  thickening visualized. No sonographic Murphy sign noted.  Common bile duct:  Diameter: 5.5 mm in the porta hepatis.  Liver:  No focal lesion identified. Within normal limits in parenchymal echogenicity.  IVC:  No abnormality visualized.  Pancreas:  Visualized portion unremarkable.  Spleen:  Size and appearance within normal limits.  6.9 cm in length.  Right Kidney:  Length: 9.9 cm. Echogenicity within normal limits. No mass or hydronephrosis visualized.  Left Kidney:  Length: 10.8 cm. Echogenicity within normal limits. No mass or hydronephrosis visualized.  Abdominal aorta:  No aneurysm visualized.  Other findings:  None.  IMPRESSION: 1. No acute findings to account for the patient's symptoms. Normal abdominal ultrasound.   Electronically Signed   By: Trudie Reedaniel  Entrikin M.D.   On: 08/21/2013 05:25   Dg Chest Portable 1 View  08/21/2013   CLINICAL DATA:  Shortness of breath.  Vomiting.  EXAM: PORTABLE CHEST - 1 VIEW  COMPARISON:  No priors.  FINDINGS: Lung volumes are normal. No consolidative airspace disease. No pleural effusions. No pneumothorax. No pulmonary nodule or mass noted. Pulmonary vasculature and the cardiomediastinal silhouette are within normal limits.  IMPRESSION: No radiographic evidence of acute cardiopulmonary disease.   Electronically Signed   By: Trudie Reedaniel  Entrikin M.D.   On: 08/21/2013 01:59     EKG Interpretation   Date/Time:  Wednesday Aug 21 2013 01:31:17 EDT Ventricular Rate:  73 PR Interval:  137 QRS Duration: 98 QT Interval:  394 QTC Calculation: 434 R Axis:   -92 Text Interpretation:  Sinus rhythm Left anterior fascicular block Abnormal  R-wave progression, late transition ST elev, probable normal early repol  pattern No old tracing to compare Confirmed by Amelie Caracci  MD, Joshia Kitchings ([id number]) on  08/21/2013 1:45:03 AM     IVFs IV phenergan for zofran "not working" IV PPI, PO pepcid and GI cocktail US reviewed with PT. On recheck symptoms improved and tolerating PO fluids. Plan d/c  home Rx pepcid and phenergan, f/u GI.   MDM   Final diagnoses:  Vomiting   ECG, labs, UA, US GB Old records reviewed including prior labs and Ct scan from previous visit.  Improved condition.  VS and nurses notes reviewed and considered    Sunnie NielsenBrian Selin Eisler, MD 08/22/13 2315

## 2015-02-10 ENCOUNTER — Encounter (HOSPITAL_COMMUNITY): Payer: Self-pay | Admitting: Emergency Medicine

## 2015-02-10 ENCOUNTER — Emergency Department (HOSPITAL_COMMUNITY)
Admission: EM | Admit: 2015-02-10 | Discharge: 2015-02-10 | Disposition: A | Discharge status: Home / Self Care | Payer: Self-pay | Attending: Emergency Medicine | Admitting: Emergency Medicine

## 2015-02-10 ENCOUNTER — Emergency Department (HOSPITAL_COMMUNITY): Payer: Self-pay

## 2015-02-10 DIAGNOSIS — M549 Dorsalgia, unspecified: Secondary | ICD-10-CM | POA: Insufficient documentation

## 2015-02-10 DIAGNOSIS — N200 Calculus of kidney: Secondary | ICD-10-CM | POA: Insufficient documentation

## 2015-02-10 DIAGNOSIS — R1032 Left lower quadrant pain: Secondary | ICD-10-CM | POA: Insufficient documentation

## 2015-02-10 LAB — URINE MICROSCOPIC-ADD ON

## 2015-02-10 LAB — COMPREHENSIVE METABOLIC PANEL
ALBUMIN: 3.6 g/dL (ref 3.5–5.0)
ALK PHOS: 92 U/L (ref 38–126)
ALT: 26 U/L (ref 17–63)
ANION GAP: 8 (ref 5–15)
AST: 26 U/L (ref 15–41)
BILIRUBIN TOTAL: 0.7 mg/dL (ref 0.3–1.2)
BUN: 13 mg/dL (ref 6–20)
CALCIUM: 9.1 mg/dL (ref 8.9–10.3)
CO2: 22 mmol/L (ref 22–32)
Chloride: 110 mmol/L (ref 101–111)
Creatinine, Ser: 0.69 mg/dL (ref 0.61–1.24)
GFR calc Af Amer: >60 mL/min (ref >60)
GLUCOSE: 122 mg/dL — AB (ref 65–99)
Potassium: 3.7 mmol/L (ref 3.5–5.1)
Sodium: 140 mmol/L (ref 135–145)
TOTAL PROTEIN: 6.4 g/dL — AB (ref 6.5–8.1)

## 2015-02-10 LAB — URINALYSIS, ROUTINE W REFLEX MICROSCOPIC
GLUCOSE, UA: NEGATIVE mg/dL
Ketones, ur: 40 mg/dL — AB
Nitrite: POSITIVE — AB
PH: 5 (ref 5.0–8.0)
Protein, ur: 100 mg/dL — AB
SPECIFIC GRAVITY, URINE: 1.029 (ref 1.005–1.030)
Urobilinogen, UA: 1 mg/dL (ref 0.0–1.0)

## 2015-02-10 LAB — CBC
HCT: 38.9 % — ABNORMAL LOW (ref 39.0–52.0)
HEMOGLOBIN: 13.5 g/dL (ref 13.0–17.0)
MCH: 29 pg (ref 26.0–34.0)
MCHC: 34.7 g/dL (ref 30.0–36.0)
MCV: 83.7 fL (ref 78.0–100.0)
Platelets: 261 10*3/uL (ref 150–400)
RBC: 4.65 MIL/uL (ref 4.22–5.81)
RDW: 12.7 % (ref 11.5–15.5)
WBC: 7.6 10*3/uL (ref 4.0–10.5)

## 2015-02-10 MED ORDER — HYDROCODONE-ACETAMINOPHEN 5-325 MG PO TABS: 1.0000 | ORAL_TABLET | Freq: Four times a day (QID) | ORAL | Status: DC | PRN

## 2015-02-10 MED ORDER — KETOROLAC TROMETHAMINE 30 MG/ML IJ SOLN
30.0000 mg | Freq: Once | INTRAMUSCULAR | Status: AC
  Administered 2015-02-10: 30 mg via INTRAVENOUS
  Filled 2015-02-10: qty 1

## 2015-02-10 NOTE — Discharge Instructions (Signed)
Clculos renales (Kidney Stones) Los clculos renales (urolitiasis) son masas slidas que se forman en el interior de los riones. El dolor intenso es causado por el movimiento de la piedra a travs del tracto urinario. Cuando la piedra se mueve, el urter hace un espasmo alrededor de la misma. El clculo generalmente se elimina con la orina.  CAUSAS   Un trastorno que hace que ciertas glndulas del cuello produzcan demasiada hormona paratiroidea (hiperparatiroidismo primario).  Una acumulacin de cristales de cido rico, similar a la gota en las articulaciones.  Estrechamiento (constriccin) del urter.  Obstruccin en el rin presente al nacer (obstruccin congnita).  Cirugas previas del rin o los urteres.  Numerosas infecciones renales. SNTOMAS   Ganas de vomitar (nuseas).  Devolver la comida (vomitar).  Sangre en la orina (hematuria).  Dolor que generalmente se expande (irradia) hacia la ingle.  Ganas de orinar con frecuencia o de manera urgente. DIAGNSTICO   Historia clnica y examen fsico.  Anlisis de sangre y orina.  Tomografa computada.  En algunos casos se realiza un examen del interior de la vejiga (citoscopa). TRATAMIENTO   Observacin.  Aumentar la ingesta de lquidos.  Litotricia extracorprea con ondas de choque: es un procedimiento no invasivo que utiliza ondas de choque para romper los clculos renales.  Ser necesaria la ciruga si tiene dolor muy intenso o la obstruccin persiste. Hay varios procedimientos quirrgicos. La mayora de los procedimientos se realizan con el uso de pequeos instrumentos. Slo es necesario realizar pequeas incisiones para acomodar estos instrumentos, por lo tanto el tiempo de recuperacin es mnimo. El tamao, la ubicacin y la composicin qumica de los clculos son variables importantes que determinarn la eleccin correcta de tratamiento para su caso. Comunquese con su mdico para comprender mejor su  situacin, de modo que pueda minimizar los riesgos de lesiones para usted y su rin.  INSTRUCCIONES PARA EL CUIDADO EN EL HOGAR   Beba gran cantidad de lquido para mantener la orina de tono claro o color amarillo plido. Esto ayudar a eliminar las piedras o los fragmentos.  Cuele la orina con el colador que le han provisto. Guarde todas las partculas y piedras para que las vea el profesional que lo asiste. Puede ser tan pequea como un grano de sal. Es muy importante usar el colador cada vez que orine. La recoleccin de piedras permitir al mdico analizar y verificar que efectivamente ha eliminado una piedra. El anlisis de la piedra con frecuencia permitir identificar qu puede hacer para reducir la incidencia de las recurrencias.  Slo tome medicamentos de venta libre o recetados para calmar el dolor, el malestar o bajar la fiebre, segn las indicaciones de su mdico.  Concurra a todas las visitas de control como se lo haya indicado el mdico. Esto es importante.  Si se lo indica, hgase radiografas. La ausencia de dolor no siempre significa que las piedras se han eliminado. Puede ser que simplemente hayan dejado de moverse. Si el paso de orina permanece completamente obstruido, puede causar prdida de la funcin renal o simplemente la destruccin del rin. Es su responsabilidad completar el seguimiento y las radiografas. Las ecografas del rin pueden mostrar una obstruccin y el estado del rin. Las ecografas no se asocian con la radiacin y pueden realizarse fcilmente en cuestin de minutos.  Haga cambios en la dieta diaria como se lo haya indicado el mdico. Es posible que le indiquen lo siguiente:  Limitar la cantidad de sal que consume.  Consumir 5 o ms porciones de frutas   y verduras por da.  Limitar la cantidad de carne, carne de ave, pescado y huevos que consume.  Recoger una muestra de orina durante 24 horas como se lo haya indicado el mdico. Tal vez tenga que recoger  otra muestra de orina cada 6 o 12 meses. SOLICITE ATENCIN MDICA SI:  Siente dolor que no responde a los analgsicos que le recetaron. SOLICITE ATENCIN MDICA DE INMEDIATO SI:   No puede controlar el dolor con los medicamentos que le han recetado.  Siente escalofros o fiebre.  La gravedad o la intensidad del dolor aumenta durante 18 horas y no se alivia con los analgsicos.  Presenta un nuevo episodio de dolor abdominal.  Sufre mareos o se desmaya.  No puede orinar.   Esta informacin no tiene como fin reemplazar el consejo del mdico. Asegrese de hacerle al mdico cualquier pregunta que tenga.   Document Released: 03/21/2005 Document Revised: 12/10/2014 Elsevier Interactive Patient Education 2016 [employer]  

## 2015-02-10 NOTE — ED Provider Notes (Signed)
CSN: [id number]     Arrival date & time 02/10/15  1943 History   First MD Initiated Contact with Patient 02/10/15 2213     Chief Complaint  Patient presents with  . Back Pain  . Hematuria   HPI Patient presents to the emergency room with complaints of left lower quadrant abdominal pain and flank pain. Symptoms started yesterday. Pain has been moderate in intensity radiating towards the left lower quadrant. His urine has looked dark but he has not noticed any significant pain with urination. He denies any fevers. No vomiting, diarrhea or constipation. Tums persisted so he decided to come into the emergency room for evaluation. History reviewed. No pertinent past medical history. History reviewed. No pertinent past surgical history. No family history on file. Social History  Substance Use Topics  . Smoking status: Never Smoker   . Smokeless tobacco: None  . Alcohol Use: No    Review of Systems  All other systems reviewed and are negative.     Allergies  Review of patient's allergies indicates no known allergies.  Home Medications   Prior to Admission medications   Medication Sig Start Date End Date Taking? Authorizing Provider  HYDROcodone-acetaminophen (NORCO/VICODIN) 5-325 MG tablet Take 1 tablet by mouth every 6 (six) hours as needed. 02/10/15   Linwood DibblesJon Saran Laviolette, MD   BP 123/87 mmHg  Pulse 68  Temp(Src) 98 F (36.7 C) (Oral)  Resp 18  SpO2 97% Physical Exam  Constitutional: He appears well-developed and well-nourished. No distress.  HENT:  Head: Normocephalic and atraumatic.  Right Ear: External ear normal.  Left Ear: External ear normal.  Eyes: Conjunctivae are normal. Right eye exhibits no discharge. Left eye exhibits no discharge. No scleral icterus.  Neck: Neck supple. No tracheal deviation present.  Cardiovascular: Normal rate, regular rhythm and intact distal pulses.   Pulmonary/Chest: Effort normal and breath sounds normal. No stridor. No respiratory distress. He has  no wheezes. He has no rales.  Abdominal: Soft. Bowel sounds are normal. He exhibits no distension. There is tenderness in the left lower quadrant. There is no rebound and no guarding.  Mild tenderness left lower quadrant  Musculoskeletal: He exhibits no edema or tenderness.  Neurological: He is alert. He has normal strength. No cranial nerve deficit (no facial droop, extraocular movements intact, no slurred speech) or sensory deficit. He exhibits normal muscle tone. He displays no seizure activity. Coordination normal.  Skin: Skin is warm and dry. No rash noted.  Psychiatric: He has a normal mood and affect.  Nursing note and vitals reviewed.   ED Course  Procedures (including critical care time) Labs Review Labs Reviewed  URINALYSIS, ROUTINE W REFLEX [facility]CR[facility]COPIC ([facility]) - Abnormal; Notable for the following:    Color, Urine RED (*)    APPearance TURBID (*)    Hgb urine dipstick LARGE (*)    Bilirubin Urine LARGE (*)    Ketones, ur 40 (*)    Protein, ur 100 (*)    Nitrite POSITIVE (*)    Leukocytes, UA MODERATE (*)    All other components within normal limits  COMPREHENSIVE METABOLIC PANEL - Abnormal; Notable for the following:    Glucose, Bld 122 (*)    Total Protein 6.4 (*)    All other components within normal limits  CBC - Abnormal; Notable for the following:    HCT 38.9 (*)    All other components within normal limits  URINE MICROSCOPIC-ADD ON - Abnormal; Notable for the following:  Crystals CA OXALATE CRYSTALS (*)    All other components within normal limits    Imaging Review Ct Abdomen Pelvis Wo Contrast  02/10/2015  CLINICAL DATA:  Left flank pain and hematuria since yesterday. EXAM: CT ABDOMEN AND PELVIS WITHOUT CONTRAST TECHNIQUE: Multidetector CT imaging of the abdomen and pelvis was performed following the standard protocol without IV contrast. COMPARISON:  08/17/2013 FINDINGS: The lung bases are clear of acute process. No pleural effusion. The heart is  normal in size. No pericardial effusion. The liver is unremarkable without contrast. No focal lesions or biliary dilatation. The gallbladder is mildly contracted. No common bile duct dilatation. No pancreatic mass, inflammation or ductal dilatation. A small duodenum diverticulum is noted near the pancreatic head the spleen is normal in size. No focal lesions. There is mild left-sided hydronephrosis and there is a 7 mm calculus in the left renal collecting system near the UPJ. This could be intermittently obstructing. No distal ureteral or bladder calculi. The right kidney is unremarkable. The stomach, duodenum, small bowel and colon are grossly normal without oral contrast. No inflammatory changes, mass lesions or obstructive findings. The terminal ileum is normal. The appendix is normal. Small scattered mesenteric lymph nodes are noted particularly in the right abdomen. These were also present on the prior study. No mass or overt adenopathy. The aorta is normal in caliber. Minimal scattered atherosclerotic calcifications. The bladder, prostate gland and seminal vesicles are unremarkable. No pelvic mass or adenopathy. No free pelvic fluid collections. No inguinal mass or adenopathy. No significant bony findings. 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 IMPRESSION: 1. 7 mm calculus in the left renal collecting system near the UPJ. This could be intermittently obstructing. No obstructing ureteral calculi or bladder calculi. 2. No other significant abdominal/pelvic findings. 3. 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 Electronically Signed   By: Rudie Meyer M.D.   On: 02/10/2015 23:23   I have personally reviewed and evaluated these images and lab results as part of my medical decision-making.  Medications  ketorolac (TORADOL) 30 MG/ML injection 30 mg (30 mg Intravenous Given 02/10/15 2228)     MDM   Final diagnoses:  Kidney stone    Patient's laboratory tests and x-rays reviewed. CBC and see med are  normal. Urinalysis shows hematuria and to numerous to count red blood cells on microscopic. CT scan shows a 7 mm calculus in the left renal collecting system. I suspect this source of his discomfort and hematuria. Patient is comfortable in the emergency department. I will give him a referral to urology. Rx norco.  Warning signs and precautions discussed.    Linwood Dibbles, MD 02/10/15 [phone]

## 2015-02-10 NOTE — ED Notes (Signed)
Pt. reports left lower back pain radiating to LLQ with hematuria onset yesterday , denies dysuria or fever .

## 2015-05-06 DIAGNOSIS — Z87442 Personal history of urinary calculi: Secondary | ICD-10-CM

## 2015-05-06 HISTORY — DX: Personal history of urinary calculi: Z87.442

## 2015-05-16 ENCOUNTER — Encounter (HOSPITAL_COMMUNITY): Payer: Self-pay | Admitting: *Deleted

## 2015-05-16 ENCOUNTER — Emergency Department (HOSPITAL_COMMUNITY): Payer: Self-pay

## 2015-05-16 ENCOUNTER — Emergency Department (HOSPITAL_COMMUNITY)
Admission: EM | Admit: 2015-05-16 | Discharge: 2015-05-16 | Disposition: A | Discharge status: Home / Self Care | Payer: Self-pay | Attending: Emergency Medicine | Admitting: Emergency Medicine

## 2015-05-16 DIAGNOSIS — N1330 Unspecified hydronephrosis: Secondary | ICD-10-CM | POA: Insufficient documentation

## 2015-05-16 DIAGNOSIS — R809 Proteinuria, unspecified: Secondary | ICD-10-CM | POA: Insufficient documentation

## 2015-05-16 DIAGNOSIS — N201 Calculus of ureter: Secondary | ICD-10-CM | POA: Insufficient documentation

## 2015-05-16 DIAGNOSIS — E876 Hypokalemia: Secondary | ICD-10-CM | POA: Insufficient documentation

## 2015-05-16 DIAGNOSIS — R109 Unspecified abdominal pain: Secondary | ICD-10-CM

## 2015-05-16 DIAGNOSIS — R1032 Left lower quadrant pain: Secondary | ICD-10-CM | POA: Insufficient documentation

## 2015-05-16 DIAGNOSIS — R8299 Other abnormal findings in urine: Secondary | ICD-10-CM | POA: Insufficient documentation

## 2015-05-16 DIAGNOSIS — M549 Dorsalgia, unspecified: Secondary | ICD-10-CM | POA: Insufficient documentation

## 2015-05-16 DIAGNOSIS — R319 Hematuria, unspecified: Secondary | ICD-10-CM | POA: Insufficient documentation

## 2015-05-16 DIAGNOSIS — R300 Dysuria: Secondary | ICD-10-CM | POA: Insufficient documentation

## 2015-05-16 DIAGNOSIS — D72829 Elevated white blood cell count, unspecified: Secondary | ICD-10-CM | POA: Insufficient documentation

## 2015-05-16 LAB — COMPREHENSIVE METABOLIC PANEL
ALBUMIN: 3.5 g/dL (ref 3.5–5.0)
ALK PHOS: 89 U/L (ref 38–126)
ALT: 24 U/L (ref 17–63)
ANION GAP: 11 (ref 5–15)
AST: 20 U/L (ref 15–41)
BUN: 17 mg/dL (ref 6–20)
CALCIUM: 9.3 mg/dL (ref 8.9–10.3)
CO2: 22 mmol/L (ref 22–32)
Chloride: 109 mmol/L (ref 101–111)
Creatinine, Ser: 0.95 mg/dL (ref 0.61–1.24)
GFR calc Af Amer: >60 mL/min (ref >60)
GFR calc non Af Amer: >60 mL/min (ref >60)
GLUCOSE: 110 mg/dL — AB (ref 65–99)
Potassium: 3.4 mmol/L — ABNORMAL LOW (ref 3.5–5.1)
SODIUM: 142 mmol/L (ref 135–145)
Total Bilirubin: <0.1 mg/dL — ABNORMAL LOW (ref 0.3–1.2)
Total Protein: 6.7 g/dL (ref 6.5–8.1)

## 2015-05-16 LAB — CBC WITH DIFFERENTIAL/PLATELET
Basophils Absolute: 0 10*3/uL (ref 0.0–0.1)
Basophils Relative: 0 %
EOS PCT: 1 %
Eosinophils Absolute: 0.1 10*3/uL (ref 0.0–0.7)
HEMATOCRIT: 39.9 % (ref 39.0–52.0)
HEMOGLOBIN: 13.7 g/dL (ref 13.0–17.0)
LYMPHS PCT: 21 %
Lymphs Abs: 2.5 10*3/uL (ref 0.7–4.0)
MCH: 29.1 pg (ref 26.0–34.0)
MCHC: 34.3 g/dL (ref 30.0–36.0)
MCV: 84.9 fL (ref 78.0–100.0)
MONO ABS: 1.3 10*3/uL — AB (ref 0.1–1.0)
MONOS PCT: 11 %
NEUTROS ABS: 8 10*3/uL — AB (ref 1.7–7.7)
Neutrophils Relative %: 67 %
Platelets: 247 10*3/uL (ref 150–400)
RBC: 4.7 MIL/uL (ref 4.22–5.81)
RDW: 12.7 % (ref 11.5–15.5)
WBC: 11.9 10*3/uL — ABNORMAL HIGH (ref 4.0–10.5)

## 2015-05-16 LAB — URINALYSIS, ROUTINE W REFLEX MICROSCOPIC
GLUCOSE, UA: NEGATIVE mg/dL
KETONES UR: NEGATIVE mg/dL
LEUKOCYTES UA: NEGATIVE
Nitrite: NEGATIVE
PH: 6 (ref 5.0–8.0)
Protein, ur: 100 mg/dL — AB
Specific Gravity, Urine: 1.035 — ABNORMAL HIGH (ref 1.005–1.030)

## 2015-05-16 LAB — URINE MICROSCOPIC-ADD ON

## 2015-05-16 MED ORDER — TAMSULOSIN HCL 0.4 MG PO CAPS: 0.4000 mg | ORAL_CAPSULE | Freq: Every day | ORAL | Status: AC

## 2015-05-16 MED ORDER — KETOROLAC TROMETHAMINE 60 MG/2ML IM SOLN
60.0000 mg | Freq: Once | INTRAMUSCULAR | Status: AC
  Administered 2015-05-16: 60 mg via INTRAMUSCULAR
  Filled 2015-05-16: qty 2

## 2015-05-16 MED ORDER — OXYCODONE-ACETAMINOPHEN 5-325 MG PO TABS: 2.0000 | ORAL_TABLET | Freq: Four times a day (QID) | ORAL | Status: DC | PRN

## 2015-05-16 NOTE — Discharge Instructions (Signed)
Mr. Timothy Cain [place],  [place] meeting you! Please follow-up with urology. Return to the emergency department if you develop fevers, chills, increased pain. Feel better soon!  S. Lane Hacker, PA-C

## 2015-05-16 NOTE — ED Notes (Signed)
To ct

## 2015-05-16 NOTE — ED Provider Notes (Signed)
History  By signing my name below, I, Karle Plumber, attest that this documentation has been prepared under the direction and in the presence of Lane Hacker, PA-C. Electronically Signed: Karle Plumber, ED Scribe. 05/16/2015. 10:21 PM  Chief Complaint  Patient presents with  . Flank Pain   The history is provided by the patient and medical records. No language interpreter was used.    HPI Comments:  Timothy Cain is a 45 y.o. male who presents to the Emergency Department complaining of left-sided flank pain that began three days ago. He describes his pain as 9 out of 10 pain scale, radiating anteriorly, constant. He reports associated mild dysuria. He reports PMHx of kidney stones and states the pain is similar. He has not done anything to treat his symptoms. He denies modifying factors. He denies fever, chills, nausea or vomiting.   History reviewed. No pertinent past medical history. History reviewed. No pertinent past surgical history. No family history on file. Social History  Substance Use Topics  . Smoking status: Never Smoker   . Smokeless tobacco: None  . Alcohol Use: No    Review of Systems A complete 10 system review of systems was obtained and all systems are negative except as noted in the HPI and PMH.   Allergies  Review of patient's allergies indicates no known allergies.  Home Medications   Prior to Admission medications   Medication Sig Start Date End Date Taking? Authorizing Provider  HYDROcodone-acetaminophen (NORCO/VICODIN) 5-325 MG tablet Take 1 tablet by mouth every 6 (six) hours as needed. 02/10/15   Linwood Dibbles, MD   Triage Vitals: BP 174/91 mmHg  Pulse 75  Temp(Src) 98.1 F (36.7 C) (Oral)  Resp 18  Ht  (1.676 m)  Wt 160 lb (72.576 kg)  BMI 25.84 kg/m2  SpO2 98% Physical Exam  Constitutional: He is oriented to person, place, and time. He appears well-developed and well-nourished.  HENT:  Head: Normocephalic and atraumatic.   Eyes: EOM are normal.  Neck: Normal range of motion.  Cardiovascular: Normal rate.   Pulmonary/Chest: Effort normal.  Abdominal: Soft. There is tenderness.  Left-sided CVA tenderness and LLQ tenderness.  Musculoskeletal: Normal range of motion.  Neurological: He is alert and oriented to person, place, and time.  Skin: Skin is warm and dry.  Psychiatric: He has a normal mood and affect. His behavior is normal.  Nursing note and vitals reviewed.   ED Course  Procedures  DIAGNOSTIC STUDIES: Oxygen Saturation is 98% on RA, normal by my interpretation.   COORDINATION OF CARE: 8:47 PM- Will order renal stone study. Pt verbalizes understanding and agrees to plan.  Medications  ketorolac (TORADOL) injection 60 mg (60 mg Intramuscular Given 05/16/15 2138)   Labs Review Labs Reviewed  URINALYSIS, ROUTINE W REFLEX MICROSCOPIC (NOT AT [facility]) - Abnormal; Notable for the following:    Color, Urine AMBER (*)    APPearance HAZY (*)    Specific Gravity, Urine 1.035 (*)    Hgb urine dipstick LARGE (*)    Bilirubin Urine SMALL (*)    Protein, ur 100 (*)    All other components within normal limits  CBC WITH DIFFERENTIAL/PLATELET - Abnormal; Notable for the following:    WBC 11.9 (*)    Neutro Abs 8.0 (*)    Monocytes Absolute 1.3 (*)    All other components within normal limits  COMPREHENSIVE METABOLIC PANEL - Abnormal; Notable for the following:    Potassium 3.4 (*)    Glucose, Bld 110 (*)  Total Bilirubin <0.1 (*)    All other components within normal limits  URINE MICROSCOPIC-ADD ON - Abnormal; Notable for the following:    Squamous Epithelial / LPF 0-5 (*)    Bacteria, UA RARE (*)    Crystals CA OXALATE CRYSTALS (*)    All other components within normal limits   Imaging Review Ct Abdomen Pelvis Wo Contrast  05/16/2015  CLINICAL DATA:  45 year old male with left flank pain radiating into the left lower quadrant. EXAM: CT ABDOMEN AND PELVIS WITHOUT CONTRAST TECHNIQUE:  Multidetector CT imaging of the abdomen and pelvis was performed following the standard protocol without IV contrast. COMPARISON:  CT dated 02/10/2015 FINDINGS: Evaluation of this exam is limited in the absence of intravenous contrast. Minimal bibasilar dependent atelectatic changes. The visualized lung bases are otherwise clear. No intra-abdominal free air or free fluid. The liver, gallbladder, pancreas, spread, and the adrenal glands appear unremarkable. There is a 7 mm distal left ureteral calculus with mild to moderate left hydronephrosis. There is a 3 mm nonobstructing left renal inferior pole calculus. Small amount of fluid abutting the inferior cortex of the left kidney extending along the left ureter may represent edema or small amount of extravasated urine. The right kidney is unremarkable. The urinary bladder, prostate gland, and seminal vesicles are grossly unremarkable. Moderate stool throughout the colon. There is no evidence of bowel obstruction or inflammation. A 1.6 cm duodenal diverticulum is noted. Normal appendix. The abdominal aorta and IVC appear grossly unremarkable on this noncontrast study. No portal venous gas identified. There is no adenopathy. New abdomen wall soft tissues appear unremarkable. There is mild degenerative changes of the spine. 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 No acute fracture. IMPRESSION: A 7 mm distal left ureteral calculus with mild to moderate left hydronephrosis. Electronically Signed   By: Elgie Collard M.D.   On: 05/16/2015 22:18   I have personally reviewed and evaluated these images and lab results as part of my medical decision-making.  MDM   Final diagnoses:  Flank pain   Patient non-toxic appearing and VSS. Based on patient history and physical exam, most likely etiologies include kidney stone vs MSK pain. Cauda equina, infectious etiologies less likely.  Will CT abdomen pelvis patient. UA with hematuria, small  bilirubin, proteinuria. Microscopic demonstrates calcium oxalate crystals. CBC with nonspecific leukocytosis of 11.9. CMP with hypokalemia of 3.4, otherwise unremarkable. CT demonstrates a 7 mm distal left ureteral calculus with mild to moderate left hydronephrosis.  Medications  ketorolac (TORADOL) injection 60 mg (60 mg Intramuscular Given 05/16/15 2138)   Patient feels improved after observation and/or treatment in ED.  Patient may be safely discharged home with  Discharge Medication List as of 05/16/2015 10:39 PM    START taking these medications   Details  oxyCODONE-acetaminophen (ROXICET) 5-325 MG tablet Take 2 tablets by mouth every 6 (six) hours as needed for severe pain., Starting 05/16/2015, Until Discontinued, Print    tamsulosin (FLOMAX) 0.4 MG CAPS capsule Take 1 capsule (0.4 mg total) by mouth daily., Starting 05/16/2015, Until Discontinued, Print       Discussed reasons for return. Patient to follow-up with urology. Patient in understanding and agreement with the plan.  I personally performed the services described in this documentation, which was scribed in my presence. The recorded information has been reviewed and is accurate.   Melton Krebs, PA-C 05/17/15 1610  Vanetta Mulders, MD 05/17/15 1714

## 2015-05-16 NOTE — ED Notes (Signed)
The npt is c/o lt flank and lt lower ab pain for 3 days.  He has had previously   No known injury.  Some pain when he voids  No blood seen

## 2015-05-18 ENCOUNTER — Encounter (HOSPITAL_COMMUNITY): Payer: Self-pay | Admitting: *Deleted

## 2015-05-18 ENCOUNTER — Other Ambulatory Visit: Payer: Self-pay | Admitting: Urology

## 2015-05-20 NOTE — H&P (Signed)
History of Present Illness Consultation for left ureteral stone referred by Dr. Deretha Emory. The patient developed left flank pain a few days ago. He underwent a CT scan of the emergency department which revealed a 5 x 8 mm left mid ureteral stone (ssd 12 cm, HU 770, ?scout) with proximal hydroureteronephrosis. His white count was 11.9 with a BUN of 17 and creatinine 0.95. Rare bacteria on the UA.    Patient was seen 3 months ago with left flank pain and this stone was at the left UPJ just beginning its passage.     Today, the patient continues to have moderate discomfort. He staying well-hydrated. He's had no fever. He did have some diarrhea last week. He had some gross hematuria. He is voiding without any difficulty. His vitals are stable and UA clear.     KUB reveals the stone faintly over the left sacrum.     UA clear   Surgical History Problems  1. History of No Surgical Problems  Current Meds 1. Oxycodone-Acetaminophen 5-325 MG Oral Tablet;  Therapy: (Recorded:13Feb2017) to Recorded 2. Tamsulosin HCl - 0.4 MG Oral Capsule;  Therapy: (Recorded:13Feb2017) to Recorded  Allergies Medication  1. No Known Drug Allergies  Family History Problems  1. Family history of malignant neoplasm (Z80.9) : Father  Social History Problems    Married   Never a smoker   No alcohol use   Occasional caffeine consumption   Three children  Review of Systems genitourinary1  ,  constitutional1  ,  skin1  ,  eye1  ,  otolaryngeal1  ,  hematologic/lymphatic1  ,  cardiovascular1  ,  pulmonary1  ,  endocrine1  ,  musculoskeletal1  ,  gastrointestinal1  ,  neurological1  and  psychiatric1  system(s) were reviewed and pertinent findings if present are noted and are otherwise negative1 .  Gastrointestinal:1  diarrhea1 .  Constitutional:1  night sweats1 .     1 Amended By: Jerilee Field; May 18 2015 2:59 PM EST  Vitals Vital Signs [Data Includes: Last 1 Day]  Recorded: 13Feb2017  01:22PM  Weight: 164 lb  Blood Pressure: 123 / 78 Temperature: 98.3 F Heart Rate: 73  Physical Exam Constitutional: Well nourished and well developed . No acute distress.  Neuro/Psych:. Mood and affect are appropriate.    Results/Data Urine [Data Includes: Last 1 Day]   13Feb2017  COLOR YELLOW   APPEARANCE CLEAR   SPECIFIC GRAVITY 1.025   pH 5.5   GLUCOSE NEGATIVE   BILIRUBIN NEGATIVE   KETONE NEGATIVE   BLOOD TRACE   PROTEIN NEGATIVE   NITRITE NEGATIVE   LEUKOCYTE ESTERASE NEGATIVE   SQUAMOUS EPITHELIAL/HPF 0-5 HPF  WBC 0-5 WBC/HPF  RBC 0-2 RBC/HPF  BACTERIA NONE SEEN HPF  CRYSTALS NONE SEEN HPF  CASTS NONE SEEN LPF  Yeast NONE SEEN HPF   Old records or history reviewed:Marland Kitchen  The following images/tracing/specimen were independently visualized:  CT, KUB.    Procedure KUB today-comparison to prior CT, findings: The stone is faintly visible over the left sacrum. It measures about 8 mm. The bones and the bowel gas pattern appeared normal.   Assessment Assessed  1. Left ureteral stone (N20.1)  Plan Health Maintenance  1. UA With REFLEX; [Do Not Release]; Status:Complete;   Done: 13Feb2017 01:11PM Left ureteral stone  2. Follow-up Schedule Surgery Office  Follow-up  Status: Hold For - Appointment   Requested for: 13Feb2017 3. KUB; Status:Resulted - Requires Verification;   Done: 13Feb2017 02:27PM  Discussion/Summary Left ureteral stone-  I drew the patient a picture of the anatomy. An interpreter was present. I discussed the nature risks and benefits of continued surveillance, stone passage, ureteroscopy or shockwave lithotripsy. Patient said he would like to avoid invasive procedures or stents and we will try shockwave lithotripsy. I told him the stone is faint and we may not be able to visualize with shockwave if it moves any. We did discuss the lower success rate with shockwave compared to ureteroscopy, but again patient would hope to avoid invasive procedure. We  discussed he may need a staged procedure and may need ureteroscopy.     Signatures Electronically signed by : Jerilee Field, M.D.; May 18 2015  2:59PM EST Electronically signed by : Jerilee Field, M.D.; May 18 2015  3:00PM EST

## 2015-05-21 ENCOUNTER — Ambulatory Visit (HOSPITAL_COMMUNITY): Payer: Self-pay

## 2015-05-21 ENCOUNTER — Encounter (HOSPITAL_COMMUNITY)
Admission: RE | Disposition: A | Discharge status: Home / Self Care | Payer: Self-pay | Source: Ambulatory Visit | Attending: Urology

## 2015-05-21 ENCOUNTER — Ambulatory Visit (HOSPITAL_COMMUNITY)
Admission: RE | Admit: 2015-05-21 | Discharge: 2015-05-21 | Disposition: A | Discharge status: Home / Self Care | Payer: Self-pay | Source: Ambulatory Visit | Attending: Urology | Admitting: Urology

## 2015-05-21 ENCOUNTER — Encounter (HOSPITAL_COMMUNITY): Payer: Self-pay | Admitting: *Deleted

## 2015-05-21 DIAGNOSIS — N201 Calculus of ureter: Secondary | ICD-10-CM

## 2015-05-21 DIAGNOSIS — N132 Hydronephrosis with renal and ureteral calculous obstruction: Secondary | ICD-10-CM | POA: Insufficient documentation

## 2015-05-21 DIAGNOSIS — Z87442 Personal history of urinary calculi: Secondary | ICD-10-CM | POA: Insufficient documentation

## 2015-05-21 HISTORY — DX: Personal history of urinary calculi: Z87.442

## 2015-05-21 SURGERY — LITHOTRIPSY, ESWL
Anesthesia: LOCAL | Laterality: Left

## 2015-05-21 MED ORDER — DIPHENHYDRAMINE HCL 25 MG PO CAPS
25.0000 mg | ORAL_CAPSULE | ORAL | Status: AC
  Administered 2015-05-21: 25 mg via ORAL
  Filled 2015-05-21: qty 1

## 2015-05-21 MED ORDER — SODIUM CHLORIDE 0.9 % IV SOLN
INTRAVENOUS | Status: DC
  Administered 2015-05-21: 11:00:00 via INTRAVENOUS

## 2015-05-21 MED ORDER — DIAZEPAM 5 MG PO TABS
10.0000 mg | ORAL_TABLET | ORAL | Status: AC
  Administered 2015-05-21: 10 mg via ORAL
  Filled 2015-05-21: qty 2

## 2015-05-21 MED ORDER — CIPROFLOXACIN HCL 500 MG PO TABS
500.0000 mg | ORAL_TABLET | ORAL | Status: AC
  Administered 2015-05-21: 500 mg via ORAL
  Filled 2015-05-21: qty 1

## 2015-05-21 NOTE — Discharge Instructions (Signed)
Litotricia, cuidados posteriores (Lithotripsy, Care After) Siga estas instrucciones durante las prximas semanas. Estas indicaciones le proporcionan informacin general acerca de cmo deber cuidarse despus del procedimiento. El mdico tambin podr darle instrucciones ms especficas. El tratamiento ha sido planificado segn las prcticas mdicas actuales, pero en algunos casos pueden ocurrir problemas. Comunquese con el mdico si tiene algn problema o tiene dudas despus del procedimiento. QU ESPERAR DESPUS DEL PROCEDIMIENTO   Probablemente la [place] tenga un tinte rojo durante algunos das despus del [place]. La prdida de sangre generalmente es mnima  Podr sentir dolor en la zona de la espalda o en un lado. Esto generalmente desaparece en [employer]. El procedimiento puede causar manchas o hematomas en la espalda, donde la onda de presin ingresa en la piel. Estas marcas generalmente causan slo una molestia mnima y deben desaparecer en un corto plazo.  Los fragmentos de piedra deben comenzar a eliminarse dentro de las 24 horas del [place]. Sin embargo, la demora en la eliminacin no es infrecuente.  Es posible que Stage manager, molestias y Programme researcher, broadcasting/film/video (nuseas) cuando los fragmentos rotos del clculo pasen por el tubo, desde el rin a la vejiga. Los fragmentos del clculo se pueden eliminar poco despus del procedimiento y pueden demorar hasta 4 a 8semanas.  Un pequeo nmero de pacientes podr sentir dolor intenso cuando los fragmentos de piedra no puedan pasar, lo que conduce a una obstruccin.  Si el clculo es ms grande que 1pulgada (2,5cm) de dimetro o tiene mltiples clculos cuyo dimetro combinado es mayor que 1pulgada (2,5cm), podr necesitar ms de Pharmacist, community.  Si se le coloc un stent antes del procedimiento, es posible que sienta un poco de Dentist, especialmente [place]. Puede sentir dolor o Dentist en la fosa lumbar o la espalda, o  posiblemente sienta un dolor intenso o malestar en la base del pene o en la parte inferior del abdomen. El Dentist generalmente dura unos cuantos minutos despus de haber orinado. INSTRUCCIONES PARA EL CUIDADO EN EL HOGAR   Haga reposo en su casa hasta que sienta que su energa mejora.  Utilice los medicamentos de venta libre o recetados para Primary school teacher, el malestar o la fiebre, segn se lo indique el mdico. Segn el tipo de litotricia, es posible que deba tomar antibiticos y Counsellor.  Beba gran cantidad de lquido para mantener la orina de tono claro o color amarillo plido. Esto ayuda a "limpiar" los riones. Ayuda a eliminar restos de piedras que puedan quedar y evita que vuelvan.  La [employer] de las personas pueden retomar sus actividades diarias en 1 o 2das luego de una litotricia estndar. Puede demorar ms tiempo recuperarse de una litotricia con lser y percutnea.  Filtre toda la orina a travs del filtro que se proporciona. Conserve todas las partculas y las piedras para que el mdico las examine. La piedra puede ser tan pequea como un grano de sal. Es importante que use el filtro cada vez que [place]. Todas las piedras halladas se enviarn al laboratorio para ser examinadas.  Visite al mdico luego de algunas semanas para que le realice un seguimiento. El mdico retirar el stent, en caso que se lo hayan colocado. El mdico tambin controlar que no hayan quedado partculas del clculo. SOLICITE ATENCIN MDICA SI:   El dolor no se alivia con los [employer].  Tiene una sensacin nauseosa duradera.  Siente que hay demasiada sangre en la orina.  Tiene una miccin frecuente o dolorosa que le provoca problemas  y no mejora, al [employer], 2das despus del procedimiento.  Tiene tos congestiva.  Se siente mareada.  Aparece una erupcin u otros signos que podran sugerir un problema alrgico.  Lorretta Harp reaccin o efecto  secundario por los medicamentos. SOLICITE ATENCIN MDICA DE INMEDIATO SI:   Siente un dolor intenso en la espalda o en la zona lumbar o en ambos.  Al orinar, observa solo [place].  No puede orinar.  Siente escalofros o fiebre.  Siente dolor en el pecho, le falta el aire o tiene dificultad para Industrial/product designer.  Comienza con vmitos que no ceden luego de 6 a 8horas.  Sufre un [employer].   Esta informacin no tiene Theme park manager el consejo del mdico. Asegrese de hacerle al mdico cualquier pregunta que tenga.   Document Released: 01/09/2013 Document Revised: 12/10/2014 Elsevier Interactive Patient Education 2016 [employer].  Sedacin consciente moderada en los adultos, cuidados posteriores (Moderate Conscious Sedation, Adult, Care After) Siga estas instrucciones durante las prximas semanas. Estas indicaciones le proporcionan informacin general acerca de cmo deber cuidarse despus del procedimiento. El mdico tambin podr darle instrucciones ms especficas. El tratamiento ha sido planificado segn las prcticas mdicas actuales, pero en algunos casos pueden ocurrir problemas. Comunquese con el mdico si tiene algn problema o tiene dudas despus del procedimiento. QU ESPERAR DESPUS DEL PROCEDIMIENTO  Despus del procedimiento:  Tal vez se sienta adormecido, torpe y tenga problemas de equilibrio durante varias horas.  Si comienza a Microbiologist pronto despus del procedimiento, puede tener vmitos. INSTRUCCIONES PARA EL CUIDADO EN EL HOGAR  No participe en ninguna actividad en la que podra resultar lesionado durante, al [place], [place]. No haga lo siguiente:  Conduzca vehculos.  Practique natacin.  Ande en bicicleta.  Opere maquinarias pesadas.  [place]oc[place].  Utilice herramientas.  Suba escaleras.  Trabaje en altura.  No firme documentos legales ni tome decisiones trascendentes hasta que se sienta mejor.  Si vomita, tome agua, jugo o sopa una vez que  pueda beber sin vomitar. Asegrese de no tener nuseas antes de ingerir alimentos slidos.  Utilice los medicamentos de venta libre o recetados para Primary school teacher, el malestar o la fiebre, segn se lo indique el mdico.  Asegrese de que usted y su familia comprenden totalmente todo lo relacionado con los medicamentos que le han administrado, y los efectos secundarios que pudiera sufrir.  No tome alcohol, pastillas para dormir o medicamentos que causen somnolencia durante, al menos, 24horas.  Si fuma, evite fumar sin supervisin.  Si se siente mejor, puede retomar sus actividades habituales luego de 24horas de la sedacin.  Concurra a todos los controles con su mdico. SOLICITE ATENCIN MDICA SI:  Su piel est plida o de color azulado.  Contina sintiendo nuseas o vomita.  El dolor est empeorando y no puede controlarlo con los medicamentos.  Tiene hemorragia o hinchazn.  Todava se siente somnoliento o tembloroso despus de 24 horas. SOLICITE ATENCIN MDICA DE INMEDIATO SI:  Le aparece una erupcin cutnea.  Tiene dificultad para respirar.  Tiene algn problema alrgico.  Lance Muss. ASEGRESE DE QUE:  Comprende estas instrucciones.  Controlar su afeccin.  Recibir ayuda de inmediato si no mejora o si empeora.   Esta informacin no tiene Theme park manager el consejo del mdico. Asegrese de hacerle al mdico cualquier pregunta que tenga.   Document Released: 03/26/2013 Document Revised: 04/11/2014 Elsevier Interactive Patient Education [employer]

## 2015-05-21 NOTE — Op Note (Signed)
Left distal stone - 8 mm  Left ESWL  Findings:  faint visible on KUB, confirmed on flouro - present in two planes. Stone disintegrated well.  Pt tolerated well.

## 2015-05-21 NOTE — Interval H&P Note (Signed)
History and Physical Interval Note:  05/21/2015 11:45 AM  Suezanne Jacquet  has presented today for surgery, with the diagnosis of left ureteral stone  The various methods of treatment have been discussed with the patient and family. After consideration of risks, benefits and other options for treatment, the patient has consented to  Procedure(s): LEFT EXTRACORPOREAL SHOCK WAVE LITHOTRIPSY (ESWL) (Left) as a surgical intervention .  The patient's history has been reviewed, patient examined, no change in status, stable for surgery.  I have reviewed the patient's chart and labs.  Pt not passed stone. Still having some left flank pain. No dysuria or gross hematuria. No fever. Discussed with pt KUB equivocal and the nature , r/b/a to flouro, IVP poss eswl, left. Discussed if stone not located he may need reschedule for cysto, left RGP, stent poss URS/HLL. All questions answered. He elects to proceed. He has been well. No fever. Interpreter present.    Marybel Alcott

## 2016-08-11 ENCOUNTER — Emergency Department (HOSPITAL_COMMUNITY): Payer: Self-pay

## 2016-08-11 ENCOUNTER — Emergency Department (HOSPITAL_COMMUNITY)
Admission: EM | Admit: 2016-08-11 | Discharge: 2016-08-11 | Disposition: A | Discharge status: Home / Self Care | Payer: Self-pay | Attending: Emergency Medicine | Admitting: Emergency Medicine

## 2016-08-11 ENCOUNTER — Encounter (HOSPITAL_COMMUNITY): Payer: Self-pay | Admitting: Emergency Medicine

## 2016-08-11 DIAGNOSIS — Y929 Unspecified place or not applicable: Secondary | ICD-10-CM | POA: Insufficient documentation

## 2016-08-11 DIAGNOSIS — R51 Headache: Secondary | ICD-10-CM | POA: Insufficient documentation

## 2016-08-11 DIAGNOSIS — S2242XA Multiple fractures of ribs, left side, initial encounter for closed fracture: Secondary | ICD-10-CM | POA: Insufficient documentation

## 2016-08-11 DIAGNOSIS — M79602 Pain in left arm: Secondary | ICD-10-CM | POA: Insufficient documentation

## 2016-08-11 DIAGNOSIS — M79601 Pain in right arm: Secondary | ICD-10-CM | POA: Insufficient documentation

## 2016-08-11 DIAGNOSIS — R1010 Upper abdominal pain, unspecified: Secondary | ICD-10-CM | POA: Insufficient documentation

## 2016-08-11 DIAGNOSIS — M25512 Pain in left shoulder: Secondary | ICD-10-CM | POA: Insufficient documentation

## 2016-08-11 DIAGNOSIS — Y999 Unspecified external cause status: Secondary | ICD-10-CM | POA: Insufficient documentation

## 2016-08-11 DIAGNOSIS — Y939 Activity, unspecified: Secondary | ICD-10-CM | POA: Insufficient documentation

## 2016-08-11 DIAGNOSIS — W1789XA Other fall from one level to another, initial encounter: Secondary | ICD-10-CM | POA: Insufficient documentation

## 2016-08-11 LAB — CBC WITH DIFFERENTIAL/PLATELET
Basophils Absolute: 0 10*3/uL (ref 0.0–0.1)
Basophils Relative: 0 %
EOS ABS: 0.1 10*3/uL (ref 0.0–0.7)
Eosinophils Relative: 1 %
HCT: 41.4 % (ref 39.0–52.0)
Hemoglobin: 14.4 g/dL (ref 13.0–17.0)
LYMPHS ABS: 1.9 10*3/uL (ref 0.7–4.0)
LYMPHS PCT: 17 %
MCH: 29.8 pg (ref 26.0–34.0)
MCHC: 34.8 g/dL (ref 30.0–36.0)
MCV: 85.7 fL (ref 78.0–100.0)
Monocytes Absolute: 0.8 10*3/uL (ref 0.1–1.0)
Monocytes Relative: 8 %
Neutro Abs: 8.2 10*3/uL — ABNORMAL HIGH (ref 1.7–7.7)
Neutrophils Relative %: 74 %
PLATELETS: 205 10*3/uL (ref 150–400)
RBC: 4.83 MIL/uL (ref 4.22–5.81)
RDW: 13.2 % (ref 11.5–15.5)
WBC: 11 10*3/uL — ABNORMAL HIGH (ref 4.0–10.5)

## 2016-08-11 LAB — URINALYSIS, ROUTINE W REFLEX MICROSCOPIC
Bilirubin Urine: NEGATIVE
Glucose, UA: NEGATIVE mg/dL
Ketones, ur: NEGATIVE mg/dL
Leukocytes, UA: NEGATIVE
NITRITE: NEGATIVE
PH: 5 (ref 5.0–8.0)
Protein, ur: 100 mg/dL — AB
SPECIFIC GRAVITY, URINE: 1.033 — AB (ref 1.005–1.030)

## 2016-08-11 LAB — COMPREHENSIVE METABOLIC PANEL
ALK PHOS: 96 U/L (ref 38–126)
ALT: 32 U/L (ref 17–63)
ANION GAP: 10 (ref 5–15)
AST: 43 U/L — ABNORMAL HIGH (ref 15–41)
Albumin: 4.4 g/dL (ref 3.5–5.0)
BUN: 16 mg/dL (ref 6–20)
CO2: 20 mmol/L — AB (ref 22–32)
Calcium: 9 mg/dL (ref 8.9–10.3)
Chloride: 108 mmol/L (ref 101–111)
Creatinine, Ser: 0.87 mg/dL (ref 0.61–1.24)
Glucose, Bld: 183 mg/dL — ABNORMAL HIGH (ref 65–99)
Potassium: 3.2 mmol/L — ABNORMAL LOW (ref 3.5–5.1)
SODIUM: 138 mmol/L (ref 135–145)
Total Bilirubin: 0.7 mg/dL (ref 0.3–1.2)
Total Protein: 7.3 g/dL (ref 6.5–8.1)

## 2016-08-11 LAB — LIPASE, BLOOD: Lipase: 25 U/L (ref 11–51)

## 2016-08-11 MED ORDER — MORPHINE SULFATE (PF) 4 MG/ML IV SOLN
4.0000 mg | Freq: Once | INTRAVENOUS | Status: AC
  Administered 2016-08-11: 4 mg via INTRAVENOUS
  Filled 2016-08-11: qty 1

## 2016-08-11 MED ORDER — IOPAMIDOL (ISOVUE-300) INJECTION 61%
INTRAVENOUS | Status: AC
  Administered 2016-08-11: 100 mL
  Filled 2016-08-11: qty 100

## 2016-08-11 MED ORDER — OXYCODONE-ACETAMINOPHEN 5-325 MG PO TABS: 1.0000 | ORAL_TABLET | Freq: Four times a day (QID) | ORAL | 0 refills | Status: DC | PRN

## 2016-08-11 MED ORDER — ONDANSETRON HCL 4 MG/2ML IJ SOLN
4.0000 mg | Freq: Once | INTRAMUSCULAR | Status: AC
  Administered 2016-08-11: 4 mg via INTRAVENOUS
  Filled 2016-08-11: qty 2

## 2016-08-11 MED ORDER — OXYCODONE-ACETAMINOPHEN 5-325 MG PO TABS
2.0000 | ORAL_TABLET | Freq: Once | ORAL | Status: AC
  Administered 2016-08-11: 2 via ORAL
  Filled 2016-08-11: qty 2

## 2016-08-11 MED ORDER — NAPROXEN 500 MG PO TABS
500.0000 mg | ORAL_TABLET | Freq: Two times a day (BID) | ORAL | 0 refills | Status: DC
Start: 2016-08-11 — End: 2016-11-16

## 2016-08-11 NOTE — ED Provider Notes (Signed)
[facility]-EMERGENCY DEPT Provider Note   CSN: [id number] Arrival date & time: 08/11/16  1857     History   Chief Complaint Chief Complaint  Patient presents with  . Fall    HPI Timothy Cain is a 46 y.o. male.  Patient presents after a fall occurring just prior to arrival. Patient was on a roof and he fell approximately 12 feet chest first onto a garbage can. Patient currently complains of pain across his lower chest and upper abdomen, worse on the left side. He has pain in his left arm but is able to move it well. No lower extremity or right arm pain. Wife did not see him fall, however found him on the ground. He was not initially responsive. Patient currently denies headache, neck pain, vomiting, vision changes. No lightheadedness or syncope. No difficulty breathing or cough. History obtained through  interpreter.      Past Medical History:  Diagnosis Date  . History of kidney stones 05/2015    There are no active problems to display for this patient.   Past Surgical History:  Procedure Laterality Date  . NO PAST SURGERIES         Home Medications    Prior to Admission medications   Medication Sig Start Date End Date Taking? Authorizing Provider  HYDROcodone-acetaminophen (NORCO/VICODIN) 5-325 MG tablet Take 1 tablet by mouth every 6 (six) hours as needed. 02/10/15   Linwood Dibbles, MD  oxyCODONE-acetaminophen (ROXICET) 5-325 MG tablet Take 2 tablets by mouth every 6 (six) hours as needed for severe pain. 05/16/15   Melton Krebs, PA-C  tamsulosin (FLOMAX) 0.4 MG CAPS capsule Take 1 capsule (0.4 mg total) by mouth daily. 05/16/15   Melton Krebs, PA-C    Family History No family history on file.  Social History Social History  Substance Use Topics  . Smoking status: Never Smoker  . Smokeless tobacco: Never Used  . Alcohol use No     Allergies   Patient has no known allergies.   Review of Systems Review of Systems  Constitutional:  Negative for fatigue.  HENT: Negative for tinnitus.   Eyes: Negative for photophobia, pain and visual disturbance.  Respiratory: Negative for shortness of breath.   Cardiovascular: Positive for chest pain.  Gastrointestinal: Positive for abdominal pain. Negative for nausea and vomiting.  Musculoskeletal: Negative for back pain, gait problem and neck pain.  Skin: Negative for wound.  Neurological: Negative for dizziness, weakness, light-headedness, numbness and headaches.  Psychiatric/Behavioral: Negative for confusion and decreased concentration.     Physical Exam Updated Vital Signs BP (!) 170/107   Pulse 96   Temp 98.4 F (36.9 C) (Oral)   Resp 18   Ht 5\' 5"  (1.651 m)   Wt 71.2 kg   SpO2 96%   BMI 26.13 kg/m   Physical Exam  Constitutional: He is oriented to person, place, and time. He appears well-developed and well-nourished. No distress.  HENT:  Head: Normocephalic and atraumatic. Head is without raccoon's eyes and without Battle's sign.  Right Ear: Tympanic membrane, external ear and ear canal normal. Tympanic membrane is not perforated. No hemotympanum.  Left Ear: Tympanic membrane, external ear and ear canal normal. Tympanic membrane is not perforated. No hemotympanum.  Nose: Nose normal. No mucosal edema, septal deviation or nasal septal hematoma. No epistaxis.  Mouth/Throat: Uvula is midline, oropharynx is clear and moist and mucous membranes are normal. Normal dentition. No posterior oropharyngeal edema or posterior oropharyngeal erythema.  Eyes:  Fundoscopic exam:  The right eye shows no hemorrhage.       The left eye shows no hemorrhage.  Slit lamp exam:      The right eye shows no hyphema.       The left eye shows no hyphema.  Neck: Trachea normal, normal range of motion and full passive range of motion without pain. No spinous process tenderness present. Normal range of motion present.  Cardiovascular: Normal rate, regular rhythm and normal heart sounds.     No murmur heard. Pulses:      Radial pulses are 2+ on the right side, and 2+ on the left side.  Pulmonary/Chest: Effort normal and breath sounds normal. No respiratory distress. He has no wheezes. He has no rales. He exhibits tenderness.  No visible signs of trauma including hematomas, bruising, lacerations, abrasions.   Abdominal: Soft. Bowel sounds are normal. He exhibits no distension. There is tenderness. There is guarding. There is no rebound.    No visible signs of trauma including hematomas, bruising, lacerations, abrasions.   Musculoskeletal: He exhibits tenderness.       Right shoulder: He exhibits normal range of motion, no tenderness and no bony tenderness.       Left shoulder: He exhibits tenderness. He exhibits normal range of motion and no bony tenderness.       Right elbow: He exhibits normal range of motion. No tenderness found.       Left elbow: He exhibits normal range of motion. No tenderness found.       Right wrist: He exhibits normal range of motion and no tenderness.       Left wrist: He exhibits normal range of motion and no tenderness.       Right hip: He exhibits normal range of motion and no tenderness.       Left hip: He exhibits normal range of motion and no tenderness.       Right knee: He exhibits normal range of motion. No tenderness found.       Left knee: He exhibits normal range of motion. No tenderness found.       Right ankle: He exhibits normal range of motion. No tenderness.       Left ankle: He exhibits normal range of motion. No tenderness.       Cervical back: Normal. He exhibits normal range of motion, no tenderness and no bony tenderness.       Thoracic back: Normal. He exhibits normal range of motion, no tenderness and no bony tenderness.       Lumbar back: Normal. He exhibits normal range of motion, no tenderness and no bony tenderness.       Right upper arm: He exhibits no tenderness, no bony tenderness and no swelling.       Left upper arm:  He exhibits tenderness. He exhibits no bony tenderness and no swelling.       Right forearm: He exhibits no tenderness, no bony tenderness and no swelling.       Left forearm: He exhibits tenderness. He exhibits no bony tenderness, no swelling and no deformity.       Right hand: Normal. He exhibits normal range of motion and no tenderness.       Left hand: Normal. He exhibits normal range of motion and no tenderness.       Right upper leg: He exhibits no tenderness, no bony tenderness and no swelling.       Left upper leg: He exhibits no  tenderness, no bony tenderness and no swelling.       Right lower leg: He exhibits no tenderness, no bony tenderness and no swelling.       Left lower leg: He exhibits no tenderness, no bony tenderness and no swelling.       Right foot: There is normal range of motion and no tenderness.       Left foot: There is normal range of motion and no tenderness.  Neurological: He is alert and oriented to person, place, and time. He has normal strength. No cranial nerve deficit or sensory deficit. Gait normal. GCS eye subscore is 4. GCS verbal subscore is 5. GCS motor subscore is 6.  Normal gross movement all extremities.      ED Treatments / Results  Labs (all labs ordered are listed, but only abnormal results are displayed) Labs Reviewed  CBC WITH DIFFERENTIAL/PLATELET - Abnormal; Notable for the following:       Result Value   WBC 11.0 (*)    Neutro Abs 8.2 (*)    All other components within normal limits  COMPREHENSIVE METABOLIC PANEL - Abnormal; Notable for the following:    Potassium 3.2 (*)    CO2 20 (*)    Glucose, Bld 183 (*)    AST 43 (*)    All other components within normal limits  URINALYSIS, ROUTINE W REFLEX MICROSCOPIC - Abnormal; Notable for the following:    Color, Urine AMBER (*)    APPearance HAZY (*)    Specific Gravity, Urine 1.033 (*)    Hgb urine dipstick LARGE (*)    Protein, ur 100 (*)    Bacteria, UA RARE (*)    Squamous  Epithelial / LPF 0-5 (*)    All other components within normal limits  LIPASE, BLOOD   Radiology Ct Head Wo Contrast  Result Date: 08/11/2016 CLINICAL DATA:  Larey SeatFell.  Pain. EXAM: CT HEAD WITHOUT CONTRAST CT CERVICAL SPINE WITHOUT CONTRAST TECHNIQUE: Multidetector CT imaging of the head and cervical spine was performed following the standard protocol without intravenous contrast. Multiplanar CT image reconstructions of the cervical spine were also generated. COMPARISON:  None. FINDINGS: CT HEAD FINDINGS Brain: No evidence of acute infarction, hemorrhage, hydrocephalus, extra-axial collection or mass lesion/mass effect. Physiologic lateral ventricle asymmetry, larger on the RIGHT. Normal cerebral volume. No white matter disease. Vascular: No hyperdense vessel or unexpected calcification. Skull: Normal. Negative for fracture or focal lesion. Sinuses/Orbits: No acute finding. Other: None. CT CERVICAL SPINE FINDINGS Alignment: Anatomic,  given the multiple cervical spine anomalies. Skull base and vertebrae: Multiple segmentation abnormalities, consistent with severe Klippel-Feil. C1 partially assimilated to the skull. 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 Markedly widened predental space, up to 6 mm. Chronic loss of height at C3. Hypoplastic C4-5 disc space leading to small C4 and C5 vertebral bodies. Hypoplastic C6-7 disc space leading to small C6 and C7 vertebral bodies. Soft tissues and spinal canal: No prevertebral fluid or swelling. No visible canal hematoma. Disc levels: No visible disc protrusion. Moderate stenosis is greatest at C1-2 level, with cord appears adequate based on this study. Upper chest: Reported separately. Other: None. IMPRESSION: No skull fracture or intracranial hemorrhage. Multiple cervical spine congenital anomalies, extreme form of Klippel-Feil. No visible fracture or traumatic subluxation. Electronically Signed   By: Elsie StainJohn T Curnes M.D.   On: 08/11/2016 21:21   Ct Chest W Contrast  Result Date:  08/11/2016 CLINICAL DATA:  Trauma. Patient fell 12 feet from roof. Upper abdominal pain and chest pain. EXAM: CT  CHEST, ABDOMEN, AND PELVIS WITH CONTRAST TECHNIQUE: Multidetector CT imaging of the chest, abdomen and pelvis was performed following the standard protocol during bolus administration of intravenous contrast. CONTRAST:  ISOVUE-300 IOPAMIDOL (ISOVUE-300) INJECTION 61% COMPARISON:  CT abdomen and pelvis 05/16/2015 FINDINGS: CT CHEST FINDINGS Cardiovascular: No significant vascular findings. Normal heart size. No pericardial effusion. Mediastinum/Nodes: No enlarged mediastinal, hilar, or axillary lymph nodes. Thyroid gland, trachea, and esophagus demonstrate no significant findings. Lungs/Pleura: Atelectasis in the lung bases. No focal consolidation. No pleural effusions. No pneumothorax. Airways are patent. Musculoskeletal: Normal alignment of the thoracic spine. Mild endplate hypertrophic changes. No vertebral compression. No sternal depression. Acute nondisplaced fracture of the left anterior fifth and sixth ribs and the left lateral eighth rib. CT ABDOMEN PELVIS FINDINGS Hepatobiliary: No hepatic injury or perihepatic hematoma. Gallbladder is unremarkable Pancreas: Unremarkable. No pancreatic ductal dilatation or surrounding inflammatory changes. Spleen: No splenic injury or perisplenic hematoma. Adrenals/Urinary Tract: There are two 3 mm stones in the midportion left kidney. Tiny punctate stone in the midpole right kidney. No hydronephrosis or hydroureter. Renal nephrograms are symmetrical. No focal lesion or hematoma. No adrenal gland nodules. Bladder wall is not thickened. Stomach/Bowel: Stomach and small bowel are mostly decompressed. Scattered gas and stool in the colon. No small or large bowel distention. No wall thickening or inflammatory infiltration. Appendix is normal. No mesenteric hematomas. Vascular/Lymphatic: Aortic atherosclerosis. No enlarged abdominal or pelvic lymph nodes.  Reproductive: Prostate is unremarkable. Other: No free air or free fluid in the abdomen. Abdominal wall musculature appears intact. Musculoskeletal: Normal alignment of the lumbar spine. No vertebral compression deformities. Sacrum, pelvis, and hips appear intact. IMPRESSION: Nondisplaced left fifth, sixth, and eighth rib fractures. No evidence of mediastinal or pulmonary parenchymal injury. No acute posttraumatic changes demonstrated in the abdomen or pelvis. No evidence of solid organ injury or bowel perforation. Nonobstructing intrarenal stones bilaterally. Electronically Signed   By: Burman Nieves M.D.   On: 08/11/2016 21:29   Ct Cervical Spine Wo Contrast  Result Date: 08/11/2016 CLINICAL DATA:  Larey Seat.  Pain. EXAM: CT HEAD WITHOUT CONTRAST CT CERVICAL SPINE WITHOUT CONTRAST TECHNIQUE: Multidetector CT imaging of the head and cervical spine was performed following the standard protocol without intravenous contrast. Multiplanar CT image reconstructions of the cervical spine were also generated. COMPARISON:  None. FINDINGS: CT HEAD FINDINGS Brain: No evidence of acute infarction, hemorrhage, hydrocephalus, extra-axial collection or mass lesion/mass effect. Physiologic lateral ventricle asymmetry, larger on the RIGHT. Normal cerebral volume. No white matter disease. Vascular: No hyperdense vessel or unexpected calcification. Skull: Normal. Negative for fracture or focal lesion. Sinuses/Orbits: No acute finding. Other: None. CT CERVICAL SPINE FINDINGS Alignment: Anatomic,  given the multiple cervical spine anomalies. Skull base and vertebrae: Multiple segmentation abnormalities, consistent with severe Klippel-Feil. C1 partially assimilated to the skull. 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 Markedly widened predental space, up to 6 mm. Chronic loss of height at C3. Hypoplastic C4-5 disc space leading to small C4 and C5 vertebral bodies. Hypoplastic C6-7 disc space leading to small C6 and C7 vertebral bodies. Soft  tissues and spinal canal: No prevertebral fluid or swelling. No visible canal hematoma. Disc levels: No visible disc protrusion. Moderate stenosis is greatest at C1-2 level, with cord appears adequate based on this study. Upper chest: Reported separately. Other: None. IMPRESSION: No skull fracture or intracranial hemorrhage. Multiple cervical spine congenital anomalies, extreme form of Klippel-Feil. No visible fracture or traumatic subluxation. Electronically Signed   By: Elsie Stain M.D.   On: 08/11/2016 21:21  Ct Abdomen Pelvis W Contrast  Result Date: 08/11/2016 CLINICAL DATA:  Trauma. Patient fell 12 feet from roof. Upper abdominal pain and chest pain. EXAM: CT CHEST, ABDOMEN, AND PELVIS WITH CONTRAST TECHNIQUE: Multidetector CT imaging of the chest, abdomen and pelvis was performed following the standard protocol during bolus administration of intravenous contrast. CONTRAST:  ISOVUE-300 IOPAMIDOL (ISOVUE-300) INJECTION 61% COMPARISON:  CT abdomen and pelvis 05/16/2015 FINDINGS: CT CHEST FINDINGS Cardiovascular: No significant vascular findings. Normal heart size. No pericardial effusion. Mediastinum/Nodes: No enlarged mediastinal, hilar, or axillary lymph nodes. Thyroid gland, trachea, and esophagus demonstrate no significant findings. Lungs/Pleura: Atelectasis in the lung bases. No focal consolidation. No pleural effusions. No pneumothorax. Airways are patent. Musculoskeletal: Normal alignment of the thoracic spine. Mild endplate hypertrophic changes. No vertebral compression. No sternal depression. Acute nondisplaced fracture of the left anterior fifth and sixth ribs and the left lateral eighth rib. CT ABDOMEN PELVIS FINDINGS Hepatobiliary: No hepatic injury or perihepatic hematoma. Gallbladder is unremarkable Pancreas: Unremarkable. No pancreatic ductal dilatation or surrounding inflammatory changes. Spleen: No splenic injury or perisplenic hematoma. Adrenals/Urinary Tract: There are two 3 mm  stones in the midportion left kidney. Tiny punctate stone in the midpole right kidney. No hydronephrosis or hydroureter. Renal nephrograms are symmetrical. No focal lesion or hematoma. No adrenal gland nodules. Bladder wall is not thickened. Stomach/Bowel: Stomach and small bowel are mostly decompressed. Scattered gas and stool in the colon. No small or large bowel distention. No wall thickening or inflammatory infiltration. Appendix is normal. No mesenteric hematomas. Vascular/Lymphatic: Aortic atherosclerosis. No enlarged abdominal or pelvic lymph nodes. Reproductive: Prostate is unremarkable. Other: No free air or free fluid in the abdomen. Abdominal wall musculature appears intact. Musculoskeletal: Normal alignment of the lumbar spine. No vertebral compression deformities. Sacrum, pelvis, and hips appear intact. IMPRESSION: Nondisplaced left fifth, sixth, and eighth rib fractures. No evidence of mediastinal or pulmonary parenchymal injury. No acute posttraumatic changes demonstrated in the abdomen or pelvis. No evidence of solid organ injury or bowel perforation. Nonobstructing intrarenal stones bilaterally. Electronically Signed   By: Burman Nieves M.D.   On: 08/11/2016 21:29    Procedures Procedures (including critical care time)  Medications Ordered in ED Medications  morphine 4 MG/ML injection 4 mg (4 mg Intravenous Given 08/11/16 2021)  ondansetron (ZOFRAN) injection 4 mg (4 mg Intravenous Given 08/11/16 2021)  iopamidol (ISOVUE-300) 61 % injection (100 mLs  Contrast Given 08/11/16 2044)  oxyCODONE-acetaminophen (PERCOCET/ROXICET) 5-325 MG per tablet 2 tablet (2 tablets Oral Given 08/11/16 2157)     Initial Impression / Assessment and Plan / ED Course  I have reviewed the triage vital signs and the nursing notes.  Pertinent labs & imaging results that were available during my care of the patient were reviewed by me and considered in my medical decision making (see chart for details).      Patient seen and examined. Work-up initiated. Medications ordered. Patient is hemodynamically stable. Imaging ordered. Labs ordered by nursing, pending.  Vital signs reviewed and are as follows: BP (!) 170/107   Pulse 96   Temp 98.4 F (36.9 C) (Oral)   Resp 18   Ht 5\' 5"  (1.651 m)   Wt 71.2 kg   SpO2 96%   BMI 26.13 kg/m   10:37 PM patient and wife updated on results.  Discussed findings with Dr. Effie Shy.  Will give Percocet and naproxen for pain control. I asked the nurse to give incentive spirometer.  Take 10 deep breaths every hour while awake. This  helps to expand your lungs and prevent infections like pneumonia.   Patient counseled on use of narcotic pain medications. Counseled not to combine these medications with others containing tylenol. Urged not to drink alcohol, drive, or perform any other activities that requires focus while taking these medications. The patient verbalizes understanding and agrees with the plan.   Final Clinical Impressions(s) / ED Diagnoses   Final diagnoses:  Closed fracture of multiple ribs of left side, initial encounter   Patient with fall from roof. Imaging of chest, abdomen, and pelvis significant for closed rib fractures of the left anterior fifth, sixth, and eighth ribs. Otherwise no acute injuries. Labs reassuring. Chronic hematuria likely due to renal stones. Imaging of the head and neck are negative. Patient remains neurologically intact here without signs of decompensation.  New Prescriptions New Prescriptions   NAPROXEN (NAPROSYN) 500 MG TABLET    Take 1 tablet (500 mg total) by mouth 2 (two) times daily.   OXYCODONE-ACETAMINOPHEN (PERCOCET/ROXICET) 5-325 MG TABLET    Take 1-2 tablets by mouth every 6 (six) hours as needed for severe pain.     Renne Crigler, PA-C 08/11/16 2239    Mancel Bale, MD 08/15/16 [id number]

## 2016-08-11 NOTE — ED Triage Notes (Addendum)
Pt st's he fell approx 12 feet on roof of house onto a garbage can.  Pt c/o upper abd pain and chest pain. Per pt's wife pt did have LOC.  C-collar placed in triage

## 2016-08-11 NOTE — Discharge Instructions (Signed)
Please read and follow all provided instructions.  Your diagnoses today include:  1. Closed fracture of multiple ribs of left side, initial encounter     Tests performed today include:  CT scan of your head and neck that did not show any serious injury.  CT scan of your chest, abdomen, and pelvis that showed broken 5th, 6th, and 8th ribs on the left side  Vital signs. See below for your results today.   Medications prescribed:   Percocet (oxycodone/acetaminophen) - narcotic pain medication  DO NOT drive or perform any activities that require you to be awake and alert because this medicine can make you drowsy. BE VERY CAREFUL not to take multiple medicines containing Tylenol (also called acetaminophen). Doing so can lead to an overdose which can damage your liver and cause liver failure and possibly death.   Naproxen - anti-inflammatory pain medication  Do not exceed 500mg  naproxen every 12 hours, take with food  You have been prescribed an anti-inflammatory medication or NSAID. Take with food. Take smallest effective dose for the shortest duration needed for your pain. Stop taking if you experience stomach pain or vomiting.   Take any prescribed medications only as directed.  Home care instructions:  Follow any educational materials contained in this packet.  Take 10 deep breaths every hour while awake. This helps to expand your lungs and prevent infections like pneumonia.   BE VERY CAREFUL not to take multiple medicines containing Tylenol (also called acetaminophen). Doing so can lead to an overdose which can damage your liver and cause liver failure and possibly death.   Follow-up instructions: Please follow-up with your primary care provider in the next 7 days for further evaluation of your symptoms.   Return instructions:  SEEK IMMEDIATE MEDICAL ATTENTION IF:  There is confusion or drowsiness (although children frequently become drowsy after injury).   You cannot  awaken the injured person.   You have more than one episode of vomiting.   You notice dizziness or unsteadiness which is getting worse, or inability to walk.   You have convulsions or unconsciousness.   You experience severe, persistent headaches not relieved by Tylenol.  You cannot use arms or legs normally.   There are changes in pupil sizes. (This is the black center in the colored part of the eye)   There is clear or bloody discharge from the nose or ears.   You have change in speech, vision, swallowing, or understanding.   Localized weakness, numbness, tingling, or change in bowel or bladder control.  You have any other emergent concerns.  Additional Information: You have had a head injury which does not appear to require admission at this time.  Your vital signs today were: BP 112/76    Pulse 76    Temp 98.4 F (36.9 C) (Oral)    Resp 18    Ht 5\' 5"  (1.651 m)    Wt 71.2 kg    SpO2 91%    BMI 26.13 kg/m  If your blood pressure (BP) was elevated above 135/85 this visit, please have this repeated by your doctor within one month. --------------

## 2016-08-18 ENCOUNTER — Ambulatory Visit: Payer: Self-pay | Attending: Internal Medicine | Admitting: Physician Assistant

## 2016-08-18 ENCOUNTER — Encounter: Payer: Self-pay | Admitting: Physician Assistant

## 2016-08-18 VITALS — BP 130/74 | HR 65 | Temp 98.6°F | Resp 16 | Wt 162.2 lb

## 2016-08-18 DIAGNOSIS — E876 Hypokalemia: Secondary | ICD-10-CM

## 2016-08-18 DIAGNOSIS — Z79899 Other long term (current) drug therapy: Secondary | ICD-10-CM | POA: Insufficient documentation

## 2016-08-18 DIAGNOSIS — R319 Hematuria, unspecified: Secondary | ICD-10-CM

## 2016-08-18 DIAGNOSIS — Z87442 Personal history of urinary calculi: Secondary | ICD-10-CM | POA: Insufficient documentation

## 2016-08-18 DIAGNOSIS — R739 Hyperglycemia, unspecified: Secondary | ICD-10-CM

## 2016-08-18 DIAGNOSIS — S2242XA Multiple fractures of ribs, left side, initial encounter for closed fracture: Secondary | ICD-10-CM

## 2016-08-18 DIAGNOSIS — X58XXXA Exposure to other specified factors, initial encounter: Secondary | ICD-10-CM | POA: Insufficient documentation

## 2016-08-18 LAB — POC URINALSYSI DIPSTICK (AUTOMATED)
BILIRUBIN UA: NEGATIVE
Glucose, UA: NEGATIVE
Ketones, UA: NEGATIVE
Leukocytes, UA: NEGATIVE
NITRITE UA: NEGATIVE
PH UA: 6 (ref 5.0–8.0)
Protein, UA: NEGATIVE
Spec Grav, UA: 1.02 (ref 1.010–1.025)
UROBILINOGEN UA: 1 U/dL

## 2016-08-18 LAB — POCT GLYCOSYLATED HEMOGLOBIN (HGB A1C): HEMOGLOBIN A1C: 5.6

## 2016-08-18 MED ORDER — METHOCARBAMOL 500 MG PO TABS: 500.0000 mg | ORAL_TABLET | Freq: Three times a day (TID) | ORAL | 0 refills | Status: DC

## 2016-08-18 MED ORDER — TRAMADOL HCL 50 MG PO TABS: 50.0000 mg | ORAL_TABLET | Freq: Three times a day (TID) | ORAL | 0 refills | Status: DC | PRN

## 2016-08-18 MED FILL — METHOCARBAMOL 500 MG TABLET: 500 | 30 days supply | Qty: 90 | Fill #0

## 2016-08-18 MED FILL — traMADol HCL 50 MG TABS: 50 | 10 days supply | Qty: 30 | Fill #0

## 2016-08-18 NOTE — Progress Notes (Signed)
Timothy Cain, is a 46 y.o. male  BJY:[id number]  YQM:[id number]  DOB - 1970/04/22  Subjective:  Chief Complaint and HPI: Timothy Cain is a 46 y.o. male here today to establish care and for a follow up visitAfter being seen in the ED 08/11/2016 after sustaining a fall from a roof.  He fell chest-first onto a garbage can.    ED summary of findings: Imaging of chest, abdomen, and pelvis significant for closed rib fractures of the left anterior fifth, sixth, and eighth ribs. Otherwise no acute injuries. Labs reassuring. Chronic hematuria likely due to renal stones. Imaging of the head and neck are negative. Patient remains neurologically intact here without signs of decompensation.  He has seen urology in the past for hematuria work up and kidney stones.  ED/Hospital notes reviewed.   Social History: married, works as a Designer, fashion/clothing  Today he c/o continued L rib pain and L arm pain.  [employer] interpreter "Gershon Cull" translating.  The naproxen helps little for the rib pain.  Oxycodone worked better but he is out.  No weakness.  No CP/SOB.  He is L hand dominant  ED/Hospital notes reviewed.   Social History: married and works as roofer  ROS:   Constitutional:  No f/c, No night sweats, No unexplained weight loss. EENT:  No vision changes, No blurry vision, No hearing changes. No mouth, throat, or ear problems.  Respiratory: No cough, No SOB Cardiac: No CP(other than rib fractures), no palpitations GI:  No abd pain, No N/V/D. GU: No Urinary s/sx Musculoskeletal: +rib and L arm pain Neuro: No headache, no dizziness, no motor weakness.  Skin: No rash Endocrine:  No polydipsia. No polyuria.  Psych: Denies SI/HI  No problems updated.  ALLERGIES: No Known Allergies  PAST MEDICAL HISTORY: Past Medical History:  Diagnosis Date  . History of kidney stones 05/2015    MEDICATIONS AT HOME: Prior to Admission medications   Medication Sig Start Date End Date Taking? Authorizing Provider    methocarbamol (ROBAXIN) 500 MG tablet Take 1 tablet (500 mg total) by mouth 3 (three) times daily. X 10 days then prn pain 08/18/16   Georgian Co M, PA-C  naproxen (NAPROSYN) 500 MG tablet Take 1 tablet (500 mg total) by mouth 2 (two) times daily. 08/11/16   Renne Crigler, PA-C  tamsulosin (FLOMAX) 0.4 MG CAPS capsule Take 1 capsule (0.4 mg total) by mouth daily. 05/16/15   Melton Krebs, PA-C  traMADol (ULTRAM) 50 MG tablet Take 1 tablet (50 mg total) by mouth every 8 (eight) hours as needed. Only For severe pain in addition to naproxen and methocarbamol if needed. 08/18/16   Anders Simmonds, PA-C     Objective:  EXAM:   Vitals:   08/18/16 1437  BP: 130/74  Pulse: 65  Resp: 16  Temp: 98.6 F (37 C)  TempSrc: Oral  SpO2: 96%  Weight: 162 lb 3.2 oz (73.6 kg)    General appearance : A&OX3. NAD. Non-toxic-appearing HEENT: Atraumatic and Normocephalic.  PERRLA. EOM intact.  TM clear B. Mouth-MMM, post pharynx WNL w/o erythema, No PND. Neck: supple, no JVD. No cervical lymphadenopathy. No thyromegaly Chest/Lungs:  Breathing-non-labored, Good air entry bilaterally, breath sounds normal without rales, rhonchi, or wheezing  CVS: S1 S2 regular, no murmurs, gallops, rubs  Extremities: Bilateral Lower Ext shows no edema, both legs are warm to touch with = pulse throughout.  BUE with full S&ROM with sensory in tact.  DTR=B Neurology:  CN II-XII grossly intact, Non focal.  Psych:  TP linear. J/I WNL. Normal speech. Appropriate eye contact and affect.  Skin:  No Rash  Data Review Lab Results  Component Value Date   HGBA1C 5.6 08/18/2016     Assessment & Plan   1. Hyperglycemia I have had a lengthy discussion and provided education about insulin resistance and the intake of too much sugar/refined carbohydrates.  I have advised the patient to work at a goal of eliminating sugary drinks, candy, desserts, sweets, refined sugars, processed foods, and white carbohydrates.  The  patient expresses understanding.  - POCT glycosylated hemoglobin (Hb A1C) - Comprehensive metabolic panel  2. Hematuria, unspecified type Keep any needed f/up with urology - POCT Urinalysis Dipstick (Automated) - CBC with Differential/Platelet  3. Hypokalemia - Comprehensive metabolic panel  4. Closed fracture of multiple ribs of left side, initial encounter - CBC with Differential/Platelet - methocarbamol (ROBAXIN) 500 MG tablet; Take 1 tablet (500 mg total) by mouth 3 (three) times daily. X 10 days then prn pain  Dispense: 90 tablet; Refill: 0 Take with Naproxen X 10 days then prn - traMADol (ULTRAM) 50 MG tablet; Take 1 tablet (50 mg total) by mouth every 8 (eight) hours as needed. Only For severe pain in addition to naproxen and methocarbamol if needed.  Dispense: 30 tablet; Refill: 0  Patient have been counseled extensively about nutrition and exercise  Return in about 3 weeks (around 09/08/2016) for assign PCP; f/up hyperglycemia/low K+/rib fractures.  The patient was given clear instructions to go to ER or return to medical center if symptoms don't improve, worsen or new problems develop. The patient verbalized understanding. The patient was told to call to get lab results if they haven't heard anything in the next week.     Georgian CoAngela McClung, PA-C [facility] [place], [place] [phone]   08/18/2016, 3:05 PM

## 2016-08-19 LAB — CBC WITH DIFFERENTIAL/PLATELET
BASOS: 0 %
Basophils Absolute: 0 10*3/uL (ref 0.0–0.2)
EOS (ABSOLUTE): 0.2 10*3/uL (ref 0.0–0.4)
EOS: 3 %
HEMATOCRIT: 41.1 % (ref 37.5–51.0)
HEMOGLOBIN: 14.1 g/dL (ref 13.0–17.7)
Immature Grans (Abs): 0 10*3/uL (ref 0.0–0.1)
Immature Granulocytes: 0 %
LYMPHS ABS: 1.9 10*3/uL (ref 0.7–3.1)
Lymphs: 28 %
MCH: 29.9 pg (ref 26.6–33.0)
MCHC: 34.3 g/dL (ref 31.5–35.7)
MCV: 87 fL (ref 79–97)
MONOS ABS: 0.7 10*3/uL (ref 0.1–0.9)
Monocytes: 10 %
NEUTROS ABS: 4.1 10*3/uL (ref 1.4–7.0)
Neutrophils: 59 %
Platelets: 255 10*3/uL (ref 150–379)
RBC: 4.72 x10E6/uL (ref 4.14–5.80)
RDW: 13.1 % (ref 12.3–15.4)
WBC: 6.9 10*3/uL (ref 3.4–10.8)

## 2016-08-19 LAB — COMPREHENSIVE METABOLIC PANEL
ALBUMIN: 4.2 g/dL (ref 3.5–5.5)
ALT: 20 IU/L (ref 0–44)
AST: 16 IU/L (ref 0–40)
Albumin/Globulin Ratio: 1.6 (ref 1.2–2.2)
Alkaline Phosphatase: 108 IU/L (ref 39–117)
BUN / CREAT RATIO: 24 — AB (ref 9–20)
BUN: 15 mg/dL (ref 6–24)
Bilirubin Total: 0.3 mg/dL (ref 0.0–1.2)
CO2: 21 mmol/L (ref 18–29)
Calcium: 9.1 mg/dL (ref 8.7–10.2)
Chloride: 102 mmol/L (ref 96–106)
Creatinine, Ser: 0.63 mg/dL — ABNORMAL LOW (ref 0.76–1.27)
GFR, EST AFRICAN AMERICAN: 140 mL/min/{1.73_m2} (ref >59)
GFR, EST NON AFRICAN AMERICAN: 121 mL/min/{1.73_m2} (ref >59)
GLOBULIN, TOTAL: 2.7 g/dL (ref 1.5–4.5)
Glucose: 89 mg/dL (ref 65–99)
Potassium: 4.3 mmol/L (ref 3.5–5.2)
SODIUM: 141 mmol/L (ref 134–144)
TOTAL PROTEIN: 6.9 g/dL (ref 6.0–8.5)

## 2016-08-24 ENCOUNTER — Telehealth: Payer: Self-pay

## 2016-08-24 NOTE — Telephone Encounter (Signed)
[employer] [place] Id:[id number] contacted pt to go over lab results pt is aware of results and doesn't have any questions or concerns 

## 2016-09-16 ENCOUNTER — Ambulatory Visit: Payer: Self-pay | Attending: Family Medicine | Admitting: Family Medicine

## 2016-09-16 VITALS — BP 125/73 | HR 73 | Temp 98.2°F | Resp 18 | Ht 64.0 in | Wt 162.4 lb

## 2016-09-16 DIAGNOSIS — Z23 Encounter for immunization: Secondary | ICD-10-CM

## 2016-09-16 DIAGNOSIS — W132XXD Fall from, out of or through roof, subsequent encounter: Secondary | ICD-10-CM | POA: Insufficient documentation

## 2016-09-16 DIAGNOSIS — M79602 Pain in left arm: Secondary | ICD-10-CM

## 2016-09-16 DIAGNOSIS — Z9181 History of falling: Secondary | ICD-10-CM

## 2016-09-16 DIAGNOSIS — S2242XD Multiple fractures of ribs, left side, subsequent encounter for fracture with routine healing: Secondary | ICD-10-CM | POA: Insufficient documentation

## 2016-09-16 DIAGNOSIS — Z0000 Encounter for general adult medical examination without abnormal findings: Secondary | ICD-10-CM

## 2016-09-16 DIAGNOSIS — S2242XS Multiple fractures of ribs, left side, sequela: Secondary | ICD-10-CM

## 2016-09-16 MED ORDER — TRAMADOL HCL 50 MG PO TABS: 50.0000 mg | ORAL_TABLET | Freq: Three times a day (TID) | ORAL | 0 refills | Status: AC | PRN

## 2016-09-16 NOTE — Progress Notes (Signed)
Subjective:  Patient ID: Timothy Cain, male    DOB: [birth year]  Age: 46 y.o. MRN: [id number]  CC: Follow-up   HPI Timothy JacquetJaime Patricio Guerreiro presents for follow up. Recent history of ED visit for left sided closed rib fractures after falling from a roof and landing chest first onto a garbage can. He denies any SOB, chest pain, coughing, hemoptysis, or bruising. He does reports tenderness to the left rib cage and pain to the left shoulder. Reports pain interferes with his ability to sleep at night.   Outpatient Medications Prior to Visit  Medication Sig Dispense Refill  . methocarbamol (ROBAXIN) 500 MG tablet Take 1 tablet (500 mg total) by mouth 3 (three) times daily. X 10 days then prn pain 90 tablet 0  . naproxen (NAPROSYN) 500 MG tablet Take 1 tablet (500 mg total) by mouth 2 (two) times daily. 45 tablet 0  . tamsulosin (FLOMAX) 0.4 MG CAPS capsule Take 1 capsule (0.4 mg total) by mouth daily. 30 capsule 0  . traMADol (ULTRAM) 50 MG tablet Take 1 tablet (50 mg total) by mouth every 8 (eight) hours as needed. Only For severe pain in addition to naproxen and methocarbamol if needed. 30 tablet 0   No facility-administered medications prior to visit.     ROS Review of Systems  Constitutional: Negative.   Respiratory: Negative.   Cardiovascular: Negative.   Gastrointestinal: Negative.   Musculoskeletal: Positive for arthralgias and myalgias.  Skin: Negative.   Neurological: Negative.     Objective:  BP 125/73 (BP Location: Left Arm, Patient Position: Sitting, Cuff Size: Normal)   Pulse 73   Temp 98.2 F (36.8 C) (Oral)   Resp 18   Ht 5\' 4"  (1.626 m)   Wt 162 lb 6.4 oz (73.7 kg)   SpO2 97%   BMI 27.88 kg/m   BP/Weight 09/16/2016 08/18/2016 08/11/2016  Systolic BP 125 130 118  Diastolic BP 73 74 71  Wt. (Lbs) 162.4 162.2 157  BMI 27.88 26.99 26.13     Physical Exam  Constitutional: He appears well-developed and well-nourished.  Neck: No JVD present.  Cardiovascular:  Normal rate, regular rhythm, normal heart sounds and intact distal pulses.   Pulmonary/Chest: Effort normal and breath sounds normal.  Abdominal: Soft. Bowel sounds are normal.  Musculoskeletal: Normal range of motion.       Left shoulder: He exhibits pain. He exhibits normal range of motion and no spasm.  Bony tenderness-left sided rib cage.  Skin: Skin is warm and dry.  Nursing note and vitals reviewed.  Assessment & Plan:   Problem List Items Addressed This Visit    None    Visit Diagnoses    Closed fracture of multiple ribs of left side, sequela    -  Primary   Relevant Medications   traMADol (ULTRAM) 50 MG tablet   Musculoskeletal pain of left upper extremity       Relevant Medications   traMADol (ULTRAM) 50 MG tablet   Other Relevant Orders   DG Humerus Left (Completed)   History of fall within past 90 days       Relevant Orders   DG Humerus Left (Completed)   Healthcare maintenance       Relevant Orders   Tdap vaccine greater than or equal to 7yo IM (Completed)      Meds ordered this encounter  Medications  . traMADol (ULTRAM) 50 MG tablet    Sig: Take 1 tablet (50 mg total) by mouth every 8 (  eight) hours as needed. Only For severe pain in addition to naproxen and methocarbamol if needed.    Dispense:  30 tablet    Refill:  0    Order Specific Question:   Supervising Provider    Answer:   Quentin Angst [id number]    Follow-up: Return in about 8 weeks (around 11/11/2016) for History of rib fracture.   Lizbeth Bark FNP

## 2016-09-16 NOTE — Progress Notes (Signed)
Patient is here for f/up  

## 2016-09-16 NOTE — Patient Instructions (Signed)
Fracturas de costilla (Rib Fracture) Una fractura de costilla es la ruptura de uno de los huesos que la forman. Las costillas son un grupo de huesos largos y curvos que rodean el pecho y estn adheridos a la columna vertebral. Protegen a los pulmones y a otros rganos que se encuentran en la cavidad torcica. La fractura o fisura de una costilla puede ser dolorosa pero no causa otros problemas. La mayora de las fracturas de costillas se curan por s mismas luego de un tiempo. Sin embargo, puede llegar a ser ms grave si se rompen varias costillas o si se desplazan y empujan otras estructuras. CAUSAS  Un golpe directo en el pecho. Por ejemplo, esto puede ocurrir durante la prctica de deportes de contacto, un accidente automovilstico o una cada sobre un objeto duro.  Los movimientos repetitivos con una gran fuerza, como al lanzar una pelota en el bisbol, o tener episodios de tos intensos. SNTOMAS  Dolor al respirar o al toser.  Dolor cuando alguien presiona la zona lesionada. DIAGNSTICO El mdico le har un examen fsico. Le indicar diferentes estudios por imgenes para confirmar el diagnstico y buscar lesiones relacionadas. Estos estudios incluyen radiografas de trax, tomografa computada (TC), resonancia magntica (MRI) o gammagrafa sea. TRATAMIENTO La mayora de las fracturas de costillas se curan por s mismas en 1 a 3 meses. Los perodos de curacin ms prolongados generalmente se asocian a tos continua o a otras actividades que agravan el problema. Durante el perodo de curacin es muy importante el control del dolor. Generalmente se recetan medicamentos para controlar el dolor. Ser necesaria la hospitalizacin o una ciruga si hay lesiones ms graves, como las fracturas de mltiples costillas o aquellas en las que las costillas se desplazan. INSTRUCCIONES PARA EL CUIDADO EN EL HOGAR  Evite las actividades extenuantes y toda actividad o movimiento que le cause dolor. Realice las  actividades con cuidado y evite golpearse las costillas lesionadas.  Reanude la actividad sexual cuando le indique su mdico.  Tome slo medicamentos de venta libre o recetados, segn las indicaciones del mdico. No tome otros medicamentos sin consultar a su mdico.  Aplique hielo en la zona lesionada durante los primeros 1 a 2 das despus de haber recibido tratamiento o segn las indicaciones de su mdico. La aplicacin del hielo ayuda a reducir la inflamacin y el dolor. ? Ponga el hielo en una bolsa plstica. ? Colquese una toalla entre la piel y la bolsa de hielo. ? Deje el hielo durante 15 a 20 minutos, cada 2 horas mientras se encuentre despierto.  Haga ejercicios de respiracin como le indique su mdico. Esto ayudar a evitar la neumona, que es una complicacin frecuente en las fracturas de costilla. El mdico le indicar que: ? Haga respiraciones profundas varias veces al da. ? Trate de toser varias veces al da, sosteniendo el rea lesionada con una almohada. ? Use un dispositivo llamado espirmetro incentivo para realizar respiraciones profundas varias veces por da.  Beba suficiente lquido para mantener la orina clara o de color amarillo plido. Esto le ayudar a evitar el estreimiento.  No use cinturones ni sujetadores. Esta limitacin de la respiracin puede causar neumona. SOLICITE ATENCIN MDICA DE INMEDIATO SI:  Tiene fiebre.  Tiene dificultad para respirar o le falta el aire.  Tiene tos continua o elimina moco espeso o sanguinolento.  Tiene malestar estomacal (nuseas), devuelve (vomita), o tiene dolor abdominal.  El dolor aumenta y no se alivia con los medicamentos. ASEGRESE DE QUE:  Comprende estas instrucciones.    Controlar su enfermedad.  Recibir ayuda de inmediato si no mejora o si empeora. Esta informacin no tiene como fin reemplazar el consejo del mdico. Asegrese de hacerle al mdico cualquier pregunta que tenga. Document Released: 12/29/2004  Document Revised: 11/21/2012 Document Reviewed: 05/23/2012 Elsevier Interactive Patient Education  2018 Elsevier Inc.  

## 2016-09-23 ENCOUNTER — Ambulatory Visit (HOSPITAL_COMMUNITY)
Admission: RE | Admit: 2016-09-23 | Discharge: 2016-09-23 | Disposition: A | Discharge status: Home / Self Care | Payer: Self-pay | Source: Ambulatory Visit | Attending: Family Medicine | Admitting: Family Medicine

## 2016-09-23 DIAGNOSIS — M79602 Pain in left arm: Secondary | ICD-10-CM | POA: Insufficient documentation

## 2016-09-23 DIAGNOSIS — M19012 Primary osteoarthritis, left shoulder: Secondary | ICD-10-CM | POA: Insufficient documentation

## 2016-09-23 DIAGNOSIS — Z9181 History of falling: Secondary | ICD-10-CM | POA: Insufficient documentation

## 2016-09-29 ENCOUNTER — Other Ambulatory Visit: Payer: Self-pay | Admitting: Family Medicine

## 2016-09-29 DIAGNOSIS — G8929 Other chronic pain: Secondary | ICD-10-CM

## 2016-09-29 DIAGNOSIS — M19112 Post-traumatic osteoarthritis, left shoulder: Secondary | ICD-10-CM

## 2016-09-29 DIAGNOSIS — M7918 Myalgia, other site: Principal | ICD-10-CM

## 2016-09-30 ENCOUNTER — Telehealth: Payer: Self-pay

## 2016-09-30 NOTE — Telephone Encounter (Signed)
CMA call regarding x ray results   Patient Verify DOB   Patient was aware and understood  

## 2016-09-30 NOTE — Telephone Encounter (Signed)
-----   Message from Lizbeth BarkMandesia R Hairston, FNP sent at 09/29/2016  2:37 PM EDT ----- No fracture present. Arthritic changes with possible deformity due to history of injury. You will be referred to orthopedics.

## 2016-11-07 ENCOUNTER — Ambulatory Visit: Payer: Self-pay

## 2016-11-08 ENCOUNTER — Ambulatory Visit: Payer: Self-pay | Attending: Internal Medicine

## 2016-11-14 ENCOUNTER — Ambulatory Visit (HOSPITAL_COMMUNITY)
Admission: EM | Admit: 2016-11-14 | Discharge: 2016-11-14 | Disposition: A | Discharge status: Home / Self Care | Payer: Self-pay | Attending: Family Medicine | Admitting: Family Medicine

## 2016-11-14 ENCOUNTER — Ambulatory Visit (INDEPENDENT_AMBULATORY_CARE_PROVIDER_SITE_OTHER): Payer: Self-pay

## 2016-11-14 ENCOUNTER — Encounter (HOSPITAL_COMMUNITY): Payer: Self-pay | Admitting: Emergency Medicine

## 2016-11-14 DIAGNOSIS — Z23 Encounter for immunization: Secondary | ICD-10-CM

## 2016-11-14 DIAGNOSIS — S61213A Laceration without foreign body of left middle finger without damage to nail, initial encounter: Secondary | ICD-10-CM

## 2016-11-14 DIAGNOSIS — W278XXA Contact with other nonpowered hand tool, initial encounter: Secondary | ICD-10-CM

## 2016-11-14 MED ORDER — TETANUS-DIPHTH-ACELL PERTUSSIS 5-2.5-18.5 LF-MCG/0.5 IM SUSP
0.5000 mL | Freq: Once | INTRAMUSCULAR | Status: AC
  Administered 2016-11-14: 0.5 mL via INTRAMUSCULAR

## 2016-11-14 MED ORDER — TETANUS-DIPHTH-ACELL PERTUSSIS 5-2.5-18.5 LF-MCG/0.5 IM SUSP
INTRAMUSCULAR | Status: AC
  Filled 2016-11-14: qty 0.5

## 2016-11-14 MED ORDER — HYDROCODONE-ACETAMINOPHEN 5-325 MG PO TABS: 1.0000 | ORAL_TABLET | ORAL | 0 refills | Status: AC | PRN

## 2016-11-14 NOTE — ED Provider Notes (Signed)
[facility]    CSN: [id number] Arrival date & time: 11/14/16  1200     History   Chief Complaint Chief Complaint  Patient presents with  . Laceration    HPI Timothy Cain is a 46 y.o. male.   Patient has laceration to left middle finger from a saw injury.  He is unaware of TD status.   The history is provided by the patient.  Laceration  Location:  Finger Finger laceration location:  L middle finger Length:  2 cm Depth:  Cutaneous Quality: jagged   Bleeding: venous   Time since incident:  2 hours Laceration mechanism:  Metal edge Pain details:    Quality:  Aching   Severity:  Moderate   Timing:  Constant   Progression:  Worsening Foreign body present:  No foreign bodies Relieved by:  None tried Worsened by:  Nothing Tetanus status:  Unknown   Past Medical History:  Diagnosis Date  . History of kidney stones 05/2015    There are no active problems to display for this patient.   Past Surgical History:  Procedure Laterality Date  . NO PAST SURGERIES         Home Medications    Prior to Admission medications   Medication Sig Start Date End Date Taking? Authorizing Provider  HYDROcodone-acetaminophen (NORCO/VICODIN) 5-325 MG tablet Take 1 tablet by mouth every 4 (four) hours as needed. 11/14/16   Deatra Canter, FNP  methocarbamol (ROBAXIN) 500 MG tablet Take 1 tablet (500 mg total) by mouth 3 (three) times daily. X 10 days then prn pain 08/18/16   Georgian Co M, PA-C  naproxen (NAPROSYN) 500 MG tablet Take 1 tablet (500 mg total) by mouth 2 (two) times daily. 08/11/16   Renne Crigler, PA-C  tamsulosin (FLOMAX) 0.4 MG CAPS capsule Take 1 capsule (0.4 mg total) by mouth daily. 05/16/15   Melton Krebs, PA-C  traMADol (ULTRAM) 50 MG tablet Take 1 tablet (50 mg total) by mouth every 8 (eight) hours as needed. Only For severe pain in addition to naproxen and methocarbamol if needed. 09/16/16   Lizbeth Bark, FNP     Family History No family history on file.  Social History Social History  Substance Use Topics  . Smoking status: Never Smoker  . Smokeless tobacco: Never Used  . Alcohol use No     Allergies   Patient has no known allergies.   Review of Systems Review of Systems  Constitutional: Negative.   HENT: Negative.   Eyes: Negative.   Respiratory: Negative.   Cardiovascular: Negative.   Gastrointestinal: Negative.   Endocrine: Negative.   Genitourinary: Negative.   Musculoskeletal: Negative.   Skin: Positive for wound.  Allergic/Immunologic: Negative.   Neurological: Negative.   Hematological: Negative.   Psychiatric/Behavioral: Negative.      Physical Exam Triage Vital Signs ED Triage Vitals  Enc Vitals Group     BP 11/14/16 1313 135/73     Pulse Rate 11/14/16 1313 60     Resp 11/14/16 1313 18     Temp 11/14/16 1313 98.3 F (36.8 C)     Temp Source 11/14/16 1313 Oral     SpO2 11/14/16 1313 100 %     Weight --      Height --      Head Circumference --      Peak Flow --      Pain Score 11/14/16 1312 8     Pain Loc --  Pain Edu? --      Excl. in GC? --    No data found.   Updated Vital Signs BP 135/73 (BP Location: Right Arm)   Pulse 60   Temp 98.3 F (36.8 C) (Oral)   Resp 18   SpO2 100%   Visual Acuity Right Eye Distance:   Left Eye Distance:   Bilateral Distance:    Right Eye Near:   Left Eye Near:    Bilateral Near:     Physical Exam  Constitutional: He appears well-developed and well-nourished.  HENT:  Head: Normocephalic and atraumatic.  Eyes: Pupils are equal, round, and reactive to light. Conjunctivae and EOM are normal.  Neck: Normal range of motion. Neck supple.  Cardiovascular: Normal rate, regular rhythm and normal heart sounds.   Pulmonary/Chest: Effort normal and breath sounds normal.  Skin:  Laceration left middle finger approx 2 cm involving nail bed  Nursing note and vitals reviewed.    UC Treatments / Results   Labs (all labs ordered are listed, but only abnormal results are displayed) Labs Reviewed - No data to display  EKG  EKG Interpretation None       Radiology Dg Finger Middle Left  Result Date: 11/14/2016 CLINICAL DATA:  Left middle finger injury with table saw. EXAM: LEFT MIDDLE FINGER 2+V COMPARISON:  None. FINDINGS: Laceration identified in the tip of the middle finger. The soft tissue defect overlies the tuft of the distal phalanx but no underlying fracture is evident. The oblique film is limited by motion. No evidence for retained radiopaque soft tissue foreign body. IMPRESSION: Laceration distal tip middle finger with no evidence for underlying distal phalanx heel fracture or retained radiopaque soft tissue foreign body. Electronically Signed   By: Kennith CenterEric  Mansell M.D.   On: 11/14/2016 13:32    Procedures .Marland Kitchen.Laceration Repair Date/Time: 11/14/2016 3:18 PM Performed by: Deatra CanterXFORD, WILLIAM J Authorized by: Mardella LaymanHAGLER, BRIAN   Consent:    Consent obtained:  Verbal   Consent given by:  Patient   Risks discussed:  Infection and pain Anesthesia (see MAR for exact dosages):    Anesthesia method:  Local infiltration   Local anesthetic:  Lidocaine 1% w/o epi Laceration details:    Location:  Finger   Length (cm):  2   Depth (mm):  2 Repair type:    Repair type:  Simple Pre-procedure details:    Preparation:  Patient was prepped and draped in usual sterile fashion Exploration:    Hemostasis achieved with:  Direct pressure   Wound exploration: wound explored through full range of motion     Wound extent: areolar tissue violated     Contaminated: no   Treatment:    Area cleansed with:  Betadine and saline   Amount of cleaning:  Standard   Irrigation solution:  Sterile saline   Irrigation volume:  150 ml   Irrigation method:  Syringe   Visualized foreign bodies/material removed: no   Skin repair:    Repair method:  Sutures   Suture size:  4-0   Suture material:  Nylon   Number of  sutures:  4 Approximation:    Approximation:  Close Post-procedure details:    Dressing:  Antibiotic ointment   Patient tolerance of procedure:  Tolerated well, no immediate complications Comments:     2 cm laceration involving left middle finger tip closed with sutures at tip and them laceration inside the nail bed involving nailbed is closed with wound adhesive.   (including critical care time)  Medications Ordered in UC Medications  Tdap (BOOSTRIX) injection 0.5 mL (0.5 mLs Intramuscular Given 11/14/16 1430)     Initial Impression / Assessment and Plan / UC Course  I have reviewed the triage vital signs and the nursing notes.  Pertinent labs & imaging results that were available during my care of the patient were reviewed by me and considered in my medical decision making (see chart for details).       Final Clinical Impressions(s) / UC Diagnoses   Final diagnoses:  Laceration of left middle finger without foreign body without damage to nail, initial encounter    New Prescriptions Discharge Medication List as of 11/14/2016  2:21 PM    START taking these medications   Details  HYDROcodone-acetaminophen (NORCO/VICODIN) 5-325 MG tablet Take 1 tablet by mouth every 4 (four) hours as needed., Starting Mon 11/14/2016, Print         Controlled Substance Prescriptions [place] Controlled Substance Registry consulted? Yes, I have consulted the [place] Controlled Substances Registry for this patient, and feel the risk/benefit ratio today is favorable for proceeding with this prescription for a controlled substance.   Deatra Canter, [place] 11/14/16 [phone]

## 2016-11-14 NOTE — Discharge Instructions (Signed)
Follow up in 7 - 10 days for suture removal °

## 2016-11-14 NOTE — ED Triage Notes (Signed)
Laceration to left middle finger tip.  Cut finger with a saw

## 2016-11-16 ENCOUNTER — Ambulatory Visit: Payer: Self-pay | Attending: Family Medicine | Admitting: Family Medicine

## 2016-11-16 ENCOUNTER — Encounter: Payer: Self-pay | Admitting: Family Medicine

## 2016-11-16 ENCOUNTER — Other Ambulatory Visit: Payer: Self-pay

## 2016-11-16 VITALS — BP 106/68 | HR 63 | Temp 97.8°F | Resp 18 | Ht 65.0 in | Wt 157.2 lb

## 2016-11-16 DIAGNOSIS — R0789 Other chest pain: Secondary | ICD-10-CM | POA: Insufficient documentation

## 2016-11-16 DIAGNOSIS — Z8781 Personal history of (healed) traumatic fracture: Secondary | ICD-10-CM

## 2016-11-16 DIAGNOSIS — R52 Pain, unspecified: Secondary | ICD-10-CM

## 2016-11-16 MED ORDER — NAPROXEN 500 MG PO TABS: 500.0000 mg | ORAL_TABLET | Freq: Two times a day (BID) | ORAL | 0 refills | Status: AC

## 2016-11-16 MED ORDER — METHOCARBAMOL 500 MG PO TABS: 500.0000 mg | ORAL_TABLET | Freq: Three times a day (TID) | ORAL | 0 refills | Status: AC | PRN

## 2016-11-16 NOTE — Progress Notes (Signed)
Subjective:  Patient ID: Timothy Cain, male    DOB: 03-14-1971  Age: 46 y.o. MRN: [id number]  CC: Follow-up   HPI Timothy Cain presents for follow up. Histroy of left sided closed rib fractures after falling from a roof and landing chest first onto a garbage can. He denies any SOB, chest pain, coughing, hemoptysis, or bruising. He does reports tenderness and throbbing to right sided lateral chest, non-radiating. Reports pain interferes with his ability to sleep at night. Symptoms aggravated with activity. He denies any family history of heart disease or sudden cardiac death.   Outpatient Medications Prior to Visit  Medication Sig Dispense Refill  . HYDROcodone-acetaminophen (NORCO/VICODIN) 5-325 MG tablet Take 1 tablet by mouth every 4 (four) hours as needed. 6 tablet 0  . tamsulosin (FLOMAX) 0.4 MG CAPS capsule Take 1 capsule (0.4 mg total) by mouth daily. 30 capsule 0  . traMADol (ULTRAM) 50 MG tablet Take 1 tablet (50 mg total) by mouth every 8 (eight) hours as needed. Only For severe pain in addition to naproxen and methocarbamol if needed. 30 tablet 0  . methocarbamol (ROBAXIN) 500 MG tablet Take 1 tablet (500 mg total) by mouth 3 (three) times daily. X 10 days then prn pain 90 tablet 0  . naproxen (NAPROSYN) 500 MG tablet Take 1 tablet (500 mg total) by mouth 2 (two) times daily. 45 tablet 0   No facility-administered medications prior to visit.     ROS Review of Systems  Constitutional: Negative.   Respiratory: Negative.   Cardiovascular: Positive for chest pain (right sided).  Gastrointestinal: Negative.   Musculoskeletal: Positive for myalgias.  Skin: Negative.   Neurological: Negative.   Psychiatric/Behavioral: Negative.      Objective:  BP 106/68 (BP Location: Left Arm, Patient Position: Sitting, Cuff Size: Normal)   Pulse 63   Temp 97.8 F (36.6 C) (Oral)   Resp 18   Ht 5\' 5"  (1.651 m)   Wt 157 lb 3.2 oz (71.3 kg)   SpO2 96%   BMI 26.16 kg/m     BP/Weight 11/16/2016 11/14/2016 09/16/2016  Systolic BP 106 135 125  Diastolic BP 68 73 73  Wt. (Lbs) 157.2 - 162.4  BMI 26.16 - 27.88   Physical Exam  Constitutional: He appears well-developed and well-nourished.  Eyes: Pupils are equal, round, and reactive to light. Conjunctivae are normal.  Neck: No JVD present.  Cardiovascular: Normal rate, regular rhythm, normal heart sounds and intact distal pulses.   No murmur heard. Pulmonary/Chest: Effort normal and breath sounds normal. No respiratory distress. He has no wheezes. He exhibits tenderness. He exhibits no mass and no swelling.    Abdominal: Soft. Bowel sounds are normal.  Skin: Skin is warm and dry.  Nursing note and vitals reviewed.   Assessment & Plan:   Problem List Items Addressed This Visit    None    Visit Diagnoses    Right-sided chest wall pain    -  Primary   Relevant Medications   methocarbamol (ROBAXIN) 500 MG tablet   naproxen (NAPROSYN) 500 MG tablet   Other Relevant Orders   EKG 12-Lead   DG Chest 2 View (Completed)   Throbbing pain       Relevant Orders   EKG 12-Lead   Basic Metabolic Panel (Completed)   CBC with Differential (Completed)   History of rib fracture       Relevant Orders   DG Chest 2 View (Completed)      Meds  ordered this encounter  Medications  . methocarbamol (ROBAXIN) 500 MG tablet    Sig: Take 1 tablet (500 mg total) by mouth every 8 (eight) hours as needed for muscle spasms. X 10 days then prn pain    Dispense:  40 tablet    Refill:  0    Script in spanish please    Order Specific Question:   Supervising Provider    Answer:   Quentin AngstJEGEDE, OLUGBEMIGA E [[id number]]  . naproxen (NAPROSYN) 500 MG tablet    Sig: Take 1 tablet (500 mg total) by mouth 2 (two) times daily. For 10 days. Then take 1 tablet by mouth with meals as needed.    Dispense:  30 tablet    Refill:  0    Script in spanish please    Order Specific Question:   Supervising Provider    Answer:   Quentin AngstJEGEDE, OLUGBEMIGA  E [[id number]]    Follow-up: Return if symptoms worsen or fail to improve.   Lizbeth BarkMandesia R Ciro Tashiro FNP

## 2016-11-16 NOTE — Patient Instructions (Signed)
Costochondritis Costochondritis is swelling and irritation (inflammation) of the tissue (cartilage) that connects your ribs to your breastbone (sternum). This causes pain in the front of your chest. The pain usually starts gradually and involves more than one rib. What are the causes? The exact cause of this condition is not always known. It results from stress on the cartilage where your ribs attach to your sternum. The cause of this stress could be:  Chest injury (trauma).  Exercise or activity, such as lifting.  Severe coughing.  What increases the risk? You may be at higher risk for this condition if you:  Are male.  Are 30?44 years old.  Recently started a new exercise or work activity.  Have low levels of vitamin D.  Have a condition that makes you cough frequently.  What are the signs or symptoms? The main symptom of this condition is chest pain. The pain:  Usually starts gradually and can be sharp or dull.  Gets worse with deep breathing, coughing, or exercise.  Gets better with rest.  May be worse when you press on the sternum-rib connection (tenderness).  How is this diagnosed? This condition is diagnosed based on your symptoms, medical history, and a physical exam. Your health care provider will check for tenderness when pressing on your sternum. This is the most important finding. You may also have tests to rule out other causes of chest pain. These may include:  A chest X-ray to check for lung problems.  An electrocardiogram (ECG) to see if you have a heart problem that could be causing the pain.  An imaging scan to rule out a chest or rib fracture.  How is this treated? This condition usually goes away on its own over time. Your health care provider may prescribe an NSAID to reduce pain and inflammation. Your health care provider may also suggest that you:  Rest and avoid activities that make pain worse.  Apply heat or cold to the area to reduce pain  and inflammation.  Do exercises to stretch your chest muscles.  If these treatments do not help, your health care provider may inject a numbing medicine at the sternum-rib connection to help relieve the pain. Follow these instructions at home:  Avoid activities that make pain worse. This includes any activities that use chest, abdominal, and side muscles.  If directed, put ice on the painful area: ? Put ice in a plastic bag. ? Place a towel between your skin and the bag. ? Leave the ice on for 20 minutes, 2-3 times a day.  If directed, apply heat to the affected area as often as told by your health care provider. Use the heat source that your health care provider recommends, such as a moist heat pack or a heating pad. ? Place a towel between your skin and the heat source. ? Leave the heat on for 20-30 minutes. ? Remove the heat if your skin turns bright red. This is especially important if you are unable to feel pain, heat, or cold. You may have a greater risk of getting burned.  Take over-the-counter and prescription medicines only as told by your health care provider.  Return to your normal activities as told by your health care provider. Ask your health care provider what activities are safe for you.  Keep all follow-up visits as told by your health care provider. This is important. Contact a health care provider if:  You have chills or a fever.  Your pain does not go   away or it gets worse.  You have a cough that does not go away (is persistent). Get help right away if:  You have shortness of breath. This information is not intended to replace advice given to you by your health care provider. Make sure you discuss any questions you have with your health care provider. Document Released: 12/29/2004 Document Revised: 10/09/2015 Document Reviewed: 07/15/2015 Elsevier Interactive Patient Education  2018 [employer]   

## 2016-11-17 LAB — BASIC METABOLIC PANEL
BUN/Creatinine Ratio: 17 (ref 9–20)
BUN: 13 mg/dL (ref 6–24)
CALCIUM: 9.8 mg/dL (ref 8.7–10.2)
CO2: 18 mmol/L — ABNORMAL LOW (ref 20–29)
CREATININE: 0.76 mg/dL (ref 0.76–1.27)
Chloride: 103 mmol/L (ref 96–106)
GFR calc Af Amer: 129 mL/min/{1.73_m2} (ref >59)
GFR, EST NON AFRICAN AMERICAN: 112 mL/min/{1.73_m2} (ref >59)
Glucose: 93 mg/dL (ref 65–99)
Potassium: 4.5 mmol/L (ref 3.5–5.2)
Sodium: 139 mmol/L (ref 134–144)

## 2016-11-17 LAB — CBC WITH DIFFERENTIAL/PLATELET
BASOS: 0 %
Basophils Absolute: 0 10*3/uL (ref 0.0–0.2)
EOS (ABSOLUTE): 0.2 10*3/uL (ref 0.0–0.4)
EOS: 2 %
HEMOGLOBIN: 15.1 g/dL (ref 13.0–17.7)
Hematocrit: 44.6 % (ref 37.5–51.0)
IMMATURE GRANS (ABS): 0 10*3/uL (ref 0.0–0.1)
Immature Granulocytes: 0 %
LYMPHS: 27 %
Lymphocytes Absolute: 2.3 10*3/uL (ref 0.7–3.1)
MCH: 29.6 pg (ref 26.6–33.0)
MCHC: 33.9 g/dL (ref 31.5–35.7)
MCV: 88 fL (ref 79–97)
MONOCYTES: 8 %
Monocytes Absolute: 0.7 10*3/uL (ref 0.1–0.9)
NEUTROS ABS: 5.4 10*3/uL (ref 1.4–7.0)
Neutrophils: 63 %
Platelets: 239 10*3/uL (ref 150–379)
RBC: 5.1 x10E6/uL (ref 4.14–5.80)
RDW: 13.7 % (ref 12.3–15.4)
WBC: 8.6 10*3/uL (ref 3.4–10.8)

## 2016-11-24 ENCOUNTER — Ambulatory Visit (HOSPITAL_COMMUNITY)
Admission: RE | Admit: 2016-11-24 | Discharge: 2016-11-24 | Disposition: A | Discharge status: Home / Self Care | Payer: Self-pay | Source: Ambulatory Visit | Attending: Family Medicine | Admitting: Family Medicine

## 2016-11-24 DIAGNOSIS — S2232XS Fracture of one rib, left side, sequela: Secondary | ICD-10-CM | POA: Insufficient documentation

## 2016-11-24 DIAGNOSIS — Z8781 Personal history of (healed) traumatic fracture: Secondary | ICD-10-CM

## 2016-11-24 DIAGNOSIS — R0789 Other chest pain: Secondary | ICD-10-CM | POA: Insufficient documentation

## 2016-11-29 ENCOUNTER — Telehealth: Payer: Self-pay

## 2016-11-29 NOTE — Telephone Encounter (Signed)
CMA call regarding lab results   Patient did not answer but left a VM stating the reason of the call & to call back  

## 2016-11-29 NOTE — Telephone Encounter (Signed)
-----   Message from Lizbeth Bark, FNP sent at 11/28/2016  7:10 PM EDT ----- Herby Abraham showed old rib fracture to the left. Left shoulder show osteoarthritis. No fracture to the right chest wall. Negative for fluid in the lungs, mass, or collapsing of the lung. Heart size normal.  Labs that evaluated your blood cells, fluid and electrolyte balance are normal. No signs of anemia, infection, or inflammation present.

## 2019-01-21 IMAGING — CT CT CERVICAL SPINE W/O CM
4 of 7 series · 13 of 33 positions shown, 14 images · non-contrast
Comparison: None.

CLINICAL DATA: Fell.  Pain.

EXAM:
CT HEAD WITHOUT CONTRAST
CT CERVICAL SPINE WITHOUT CONTRAST
TECHNIQUE: Multidetector CT imaging of the head and cervical spine was
performed following the standard protocol without intravenous
contrast. Multiplanar CT image reconstructions of the cervical spine
were also generated.

[Series 8: c_spine 2.0 st · axial · 0.26mm/px · z∈[-251,-147]mm · 4 of 88 slices shown, 5 images]
[im 18/88  soft-tissue]
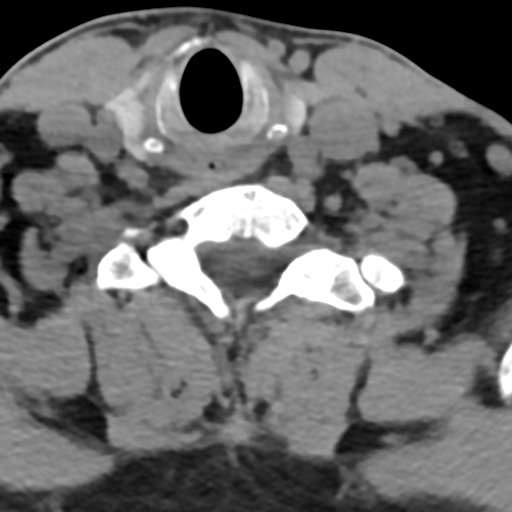
[im 18/88  bone]
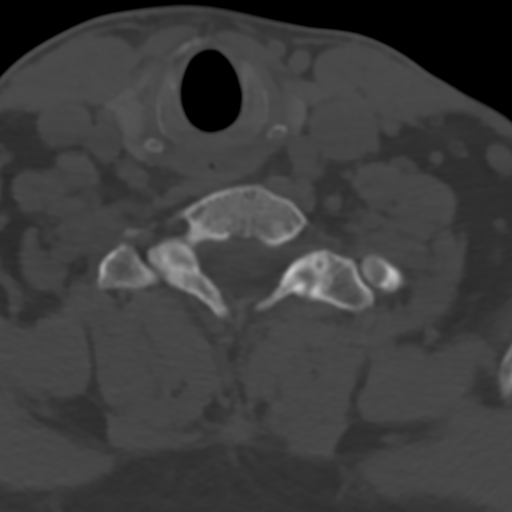
[im 35/88  bone]
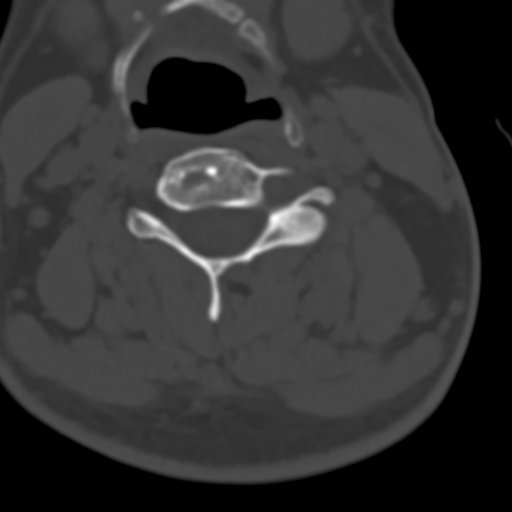
[im 53/88  bone]
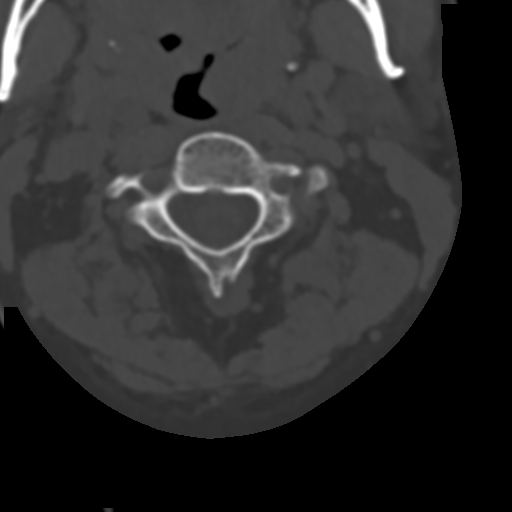
[im 70/88  bone]
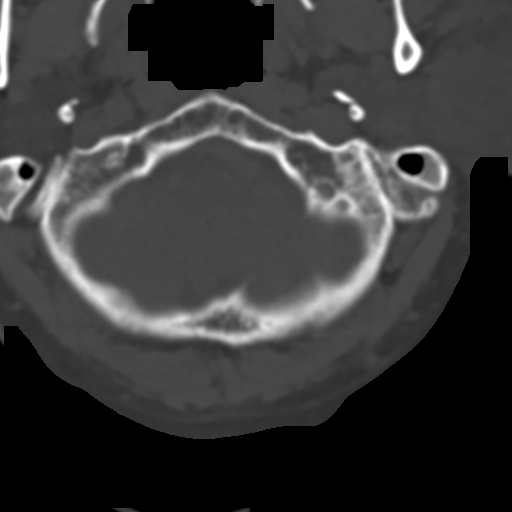

[Series 9: coronal bone · coronal · 0.23mm/px · 1 of 66 slices shown]
[im 33/66  bone]
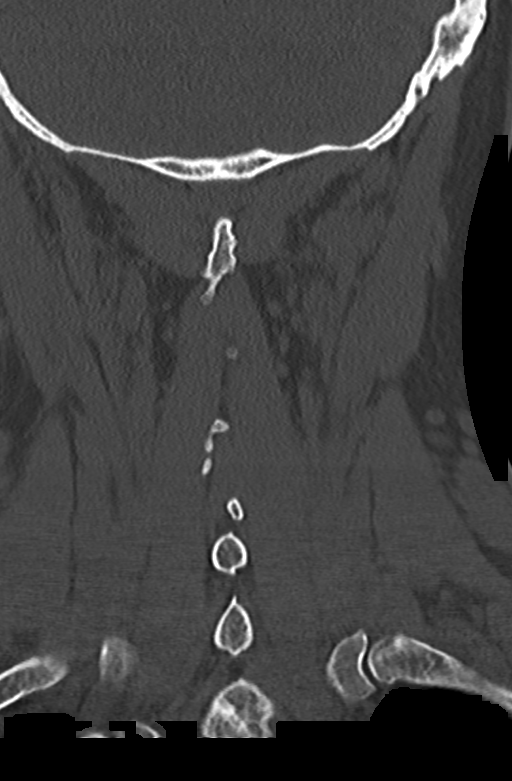

[Series 10: sagittal bone · sagittal · 0.31mm/px · 4 of 61 slices shown]
[im 13/61  bone]
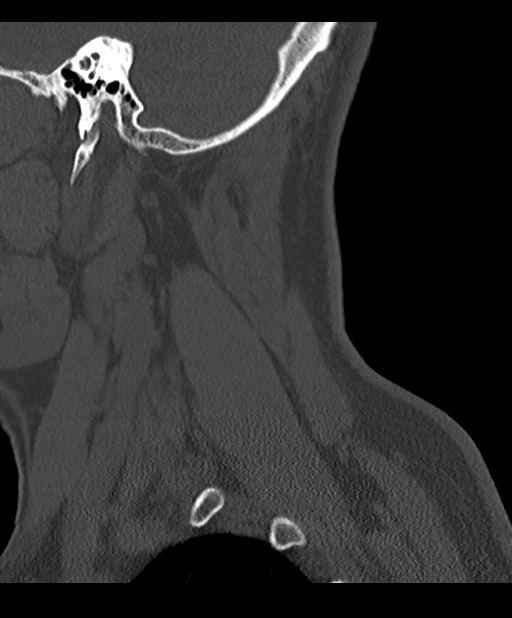
[im 25/61  bone]
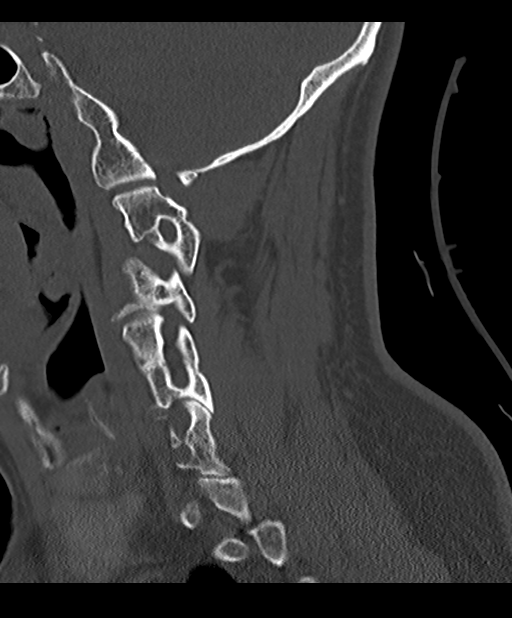
[im 37/61  bone]
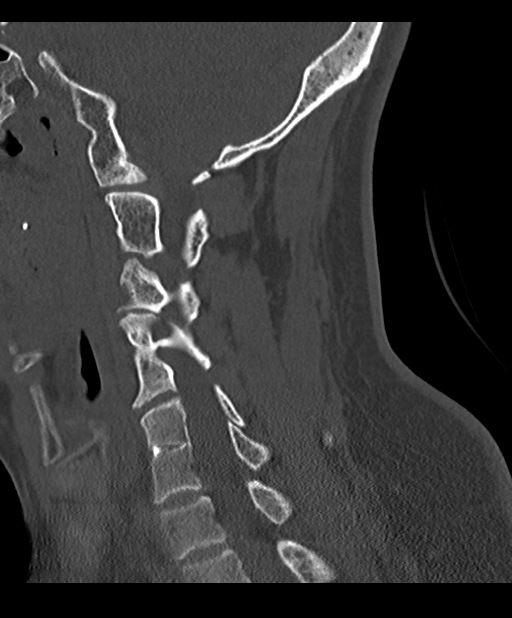
[im 49/61  bone]
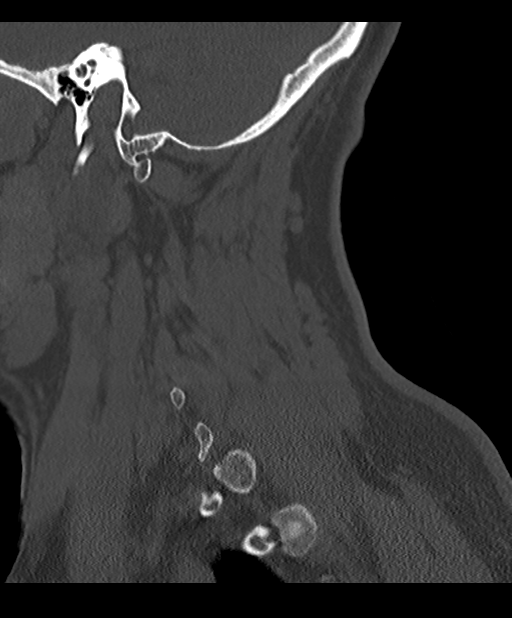

[Series 12: orthogonal axial st · axial · 0.21mm/px · z∈[-261,-170]mm · 4 of 83 slices shown]
[im 17/83  bone]
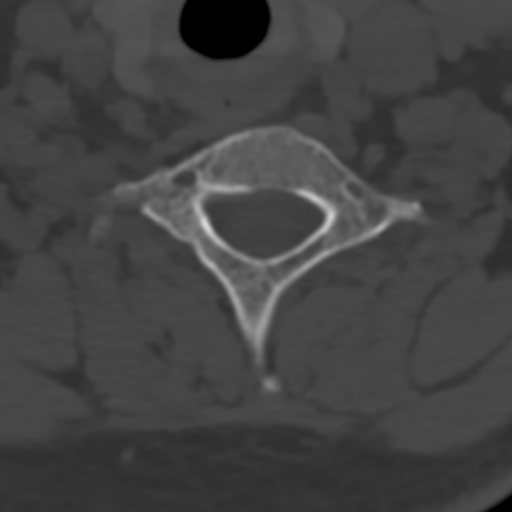
[im 33/83  bone]
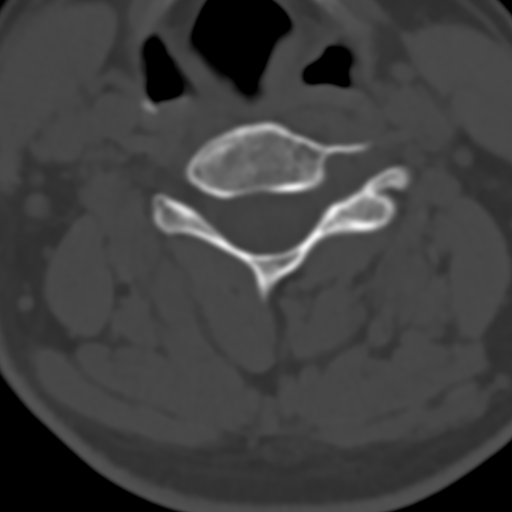
[im 50/83  bone]
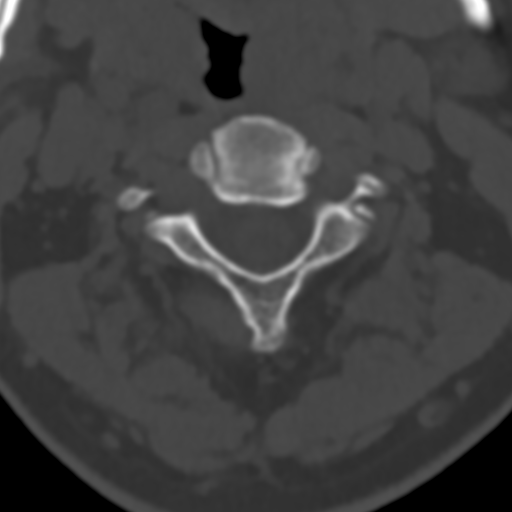
[im 66/83  bone]
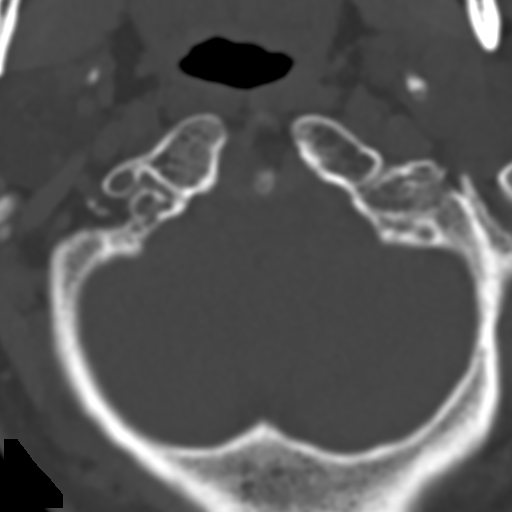

[13 of 33 positions shown; findings below may reference images not displayed]

FINDINGS: CT HEAD FINDINGS

Brain: No evidence of acute infarction, hemorrhage, hydrocephalus,
extra-axial collection or mass lesion/mass effect. Physiologic
lateral ventricle asymmetry, larger on the RIGHT. Normal cerebral
volume. No white matter disease.

Vascular: No hyperdense vessel or unexpected calcification.

Skull: Normal. Negative for fracture or focal lesion.

Sinuses/Orbits: No acute finding.

Other: None.

CT CERVICAL SPINE FINDINGS

Alignment: Anatomic,  given the multiple cervical spine anomalies.

Skull base and vertebrae: Multiple segmentation abnormalities,
consistent with severe Rbehat. C1 partially assimilated to the
skull. Odontoid is underdeveloped. Markedly widened predental space,
up to 6 mm. Chronic loss of height at C3. Hypoplastic C4-5 disc
space leading to small C4 and C5 vertebral bodies. Hypoplastic C6-7
disc space leading to small C6 and C7 vertebral bodies.

Soft tissues and spinal canal: No prevertebral fluid or swelling. No
visible canal hematoma.

Disc levels: No visible disc protrusion. Moderate stenosis is
greatest at C1-2 level, with cord appears adequate based on this
study.

Upper chest: Reported separately.

Other: None.
IMPRESSION: No skull fracture or intracranial hemorrhage.

Multiple cervical spine congenital anomalies, extreme form of
Rbehat. No visible fracture or traumatic subluxation.
